# Patient Record
Sex: Male | Born: 1960 | Race: White | Hispanic: No | Smoking: Never smoker
Health system: Southern US, Community
[De-identification: ages and names within clinical notes are randomized; demographics above are authoritative.]

## PROBLEM LIST (undated history)

## (undated) DIAGNOSIS — N200 Calculus of kidney: Secondary | ICD-10-CM

## (undated) DIAGNOSIS — E785 Hyperlipidemia, unspecified: Secondary | ICD-10-CM

## (undated) DIAGNOSIS — N401 Enlarged prostate with lower urinary tract symptoms: Secondary | ICD-10-CM

## (undated) DIAGNOSIS — K625 Hemorrhage of anus and rectum: Secondary | ICD-10-CM

## (undated) DIAGNOSIS — I10 Essential (primary) hypertension: Secondary | ICD-10-CM

## (undated) DIAGNOSIS — Z8042 Family history of malignant neoplasm of prostate: Secondary | ICD-10-CM

## (undated) DIAGNOSIS — R972 Elevated prostate specific antigen [PSA]: Secondary | ICD-10-CM

## (undated) DIAGNOSIS — N138 Other obstructive and reflux uropathy: Secondary | ICD-10-CM

## (undated) DIAGNOSIS — T7840XA Allergy, unspecified, initial encounter: Secondary | ICD-10-CM

## (undated) DIAGNOSIS — J310 Chronic rhinitis: Secondary | ICD-10-CM

## (undated) DIAGNOSIS — D500 Iron deficiency anemia secondary to blood loss (chronic): Secondary | ICD-10-CM

## (undated) DIAGNOSIS — R002 Palpitations: Secondary | ICD-10-CM

## (undated) HISTORY — DX: Hyperlipidemia, unspecified: E78.5

## (undated) HISTORY — DX: Elevated prostate specific antigen (PSA): R97.20

## (undated) HISTORY — PX: COLONOSCOPY: SHX174

## (undated) HISTORY — DX: Chronic rhinitis: J31.0

## (undated) HISTORY — DX: Iron deficiency anemia secondary to blood loss (chronic): D50.0

## (undated) HISTORY — DX: Palpitations: R00.2

## (undated) HISTORY — DX: Family history of malignant neoplasm of prostate: Z80.42

## (undated) HISTORY — DX: Benign prostatic hyperplasia with lower urinary tract symptoms: N40.1

## (undated) HISTORY — DX: Hemorrhage of anus and rectum: K62.5

## (undated) HISTORY — DX: Other obstructive and reflux uropathy: N13.8

## (undated) HISTORY — DX: Calculus of kidney: N20.0

## (undated) HISTORY — DX: Allergy, unspecified, initial encounter: T78.40XA

## (undated) HISTORY — DX: Essential (primary) hypertension: I10

---

## 1993-06-08 DIAGNOSIS — Z87448 Personal history of other diseases of urinary system: Secondary | ICD-10-CM

## 1993-06-08 DIAGNOSIS — Z87898 Personal history of other specified conditions: Secondary | ICD-10-CM

## 1993-06-08 HISTORY — DX: Personal history of other diseases of urinary system: Z87.448

## 1993-06-08 HISTORY — DX: Personal history of other specified conditions: Z87.898

## 1999-11-27 ENCOUNTER — Encounter: Admission: RE | Admit: 1999-11-27 | Discharge: 1999-11-27 | Payer: Self-pay | Admitting: *Deleted

## 1999-11-27 ENCOUNTER — Encounter: Payer: Self-pay | Admitting: *Deleted

## 2005-11-30 ENCOUNTER — Ambulatory Visit: Payer: Self-pay | Admitting: Endocrinology

## 2006-11-17 ENCOUNTER — Ambulatory Visit: Payer: Self-pay | Admitting: Endocrinology

## 2006-11-17 LAB — CONVERTED CEMR LAB
BUN: 13 mg/dL (ref 6–23)
Basophils Absolute: 0 10*3/uL (ref 0.0–0.1)
Basophils Relative: 0.6 % (ref 0.0–1.0)
CO2: 28 meq/L (ref 19–32)
Calcium: 9.6 mg/dL (ref 8.4–10.5)
Chloride: 103 meq/L (ref 96–112)
Creatinine, Ser: 1 mg/dL (ref 0.4–1.5)
Eosinophils Absolute: 0.3 10*3/uL (ref 0.0–0.6)
Eosinophils Relative: 3.3 % (ref 0.0–5.0)
GFR calc Af Amer: 103 mL/min
GFR calc non Af Amer: 85 mL/min
Glucose, Bld: 89 mg/dL (ref 70–99)
HCT: 45.6 % (ref 39.0–52.0)
Hemoglobin: 16 g/dL (ref 13.0–17.0)
Lymphocytes Relative: 34.4 % (ref 12.0–46.0)
MCHC: 35.2 g/dL (ref 30.0–36.0)
MCV: 91.5 fL (ref 78.0–100.0)
Monocytes Absolute: 0.7 10*3/uL (ref 0.2–0.7)
Monocytes Relative: 9.4 % (ref 3.0–11.0)
Neutro Abs: 4.1 10*3/uL (ref 1.4–7.7)
Neutrophils Relative %: 52.3 % (ref 43.0–77.0)
Platelets: 250 10*3/uL (ref 150–400)
Potassium: 4 meq/L (ref 3.5–5.1)
RBC: 4.99 M/uL (ref 4.22–5.81)
RDW: 11.7 % (ref 11.5–14.6)
Sodium: 139 meq/L (ref 135–145)
TSH: 1.45 microintl units/mL (ref 0.35–5.50)
WBC: 7.7 10*3/uL (ref 4.5–10.5)

## 2006-11-30 ENCOUNTER — Ambulatory Visit: Payer: Self-pay | Admitting: Endocrinology

## 2007-01-17 ENCOUNTER — Encounter: Payer: Self-pay | Admitting: Endocrinology

## 2007-01-18 ENCOUNTER — Ambulatory Visit: Payer: Self-pay | Admitting: Endocrinology

## 2007-01-18 LAB — CONVERTED CEMR LAB
ALT: 22 units/L (ref 0–53)
AST: 21 units/L (ref 0–37)
Albumin: 4.2 g/dL (ref 3.5–5.2)
Alkaline Phosphatase: 89 units/L (ref 39–117)
BUN: 11 mg/dL (ref 6–23)
Basophils Absolute: 0 10*3/uL (ref 0.0–0.1)
Basophils Relative: 0.1 % (ref 0.0–1.0)
Bilirubin Urine: NEGATIVE
Bilirubin, Direct: 0.1 mg/dL (ref 0.0–0.3)
CO2: 28 meq/L (ref 19–32)
Calcium: 8.7 mg/dL (ref 8.4–10.5)
Chloride: 107 meq/L (ref 96–112)
Cholesterol: 227 mg/dL (ref 0–200)
Creatinine, Ser: 0.9 mg/dL (ref 0.4–1.5)
Direct LDL: 163.6 mg/dL
Eosinophils Absolute: 0.3 10*3/uL (ref 0.0–0.6)
Eosinophils Relative: 3.9 % (ref 0.0–5.0)
GFR calc Af Amer: 116 mL/min
GFR calc non Af Amer: 96 mL/min
Glucose, Bld: 96 mg/dL (ref 70–99)
HCT: 44.8 % (ref 39.0–52.0)
HDL: 33.2 mg/dL — ABNORMAL LOW (ref >39.0)
Hemoglobin, Urine: NEGATIVE
Hemoglobin: 15.5 g/dL (ref 13.0–17.0)
Ketones, ur: NEGATIVE mg/dL
Leukocytes, UA: NEGATIVE
Lymphocytes Relative: 25.9 % (ref 12.0–46.0)
MCHC: 34.5 g/dL (ref 30.0–36.0)
MCV: 91.4 fL (ref 78.0–100.0)
Monocytes Absolute: 0.6 10*3/uL (ref 0.2–0.7)
Monocytes Relative: 8.2 % (ref 3.0–11.0)
Neutro Abs: 4.6 10*3/uL (ref 1.4–7.7)
Neutrophils Relative %: 61.9 % (ref 43.0–77.0)
Nitrite: NEGATIVE
PSA: 0.5 ng/mL (ref 0.10–4.00)
Platelets: 245 10*3/uL (ref 150–400)
Potassium: 4.3 meq/L (ref 3.5–5.1)
RBC: 4.9 M/uL (ref 4.22–5.81)
RDW: 11.9 % (ref 11.5–14.6)
Sodium: 141 meq/L (ref 135–145)
Specific Gravity, Urine: 1.01 (ref 1.000–1.03)
TSH: 1.28 microintl units/mL (ref 0.35–5.50)
Total Bilirubin: 0.9 mg/dL (ref 0.3–1.2)
Total CHOL/HDL Ratio: 6.8
Total Protein, Urine: NEGATIVE mg/dL
Total Protein: 7.2 g/dL (ref 6.0–8.3)
Triglycerides: 160 mg/dL — ABNORMAL HIGH (ref 0–149)
Urine Glucose: NEGATIVE mg/dL
Urobilinogen, UA: 0.2 (ref 0.0–1.0)
VLDL: 32 mg/dL (ref 0–40)
WBC: 7.4 10*3/uL (ref 4.5–10.5)
pH: 7 (ref 5.0–8.0)

## 2007-02-16 ENCOUNTER — Ambulatory Visit: Payer: Self-pay | Admitting: Gastroenterology

## 2007-02-16 ENCOUNTER — Ambulatory Visit: Payer: Self-pay | Admitting: Endocrinology

## 2007-02-16 LAB — CONVERTED CEMR LAB
ALT: 27 units/L (ref 0–53)
AST: 22 units/L (ref 0–37)
Albumin: 4.1 g/dL (ref 3.5–5.2)
Alkaline Phosphatase: 87 units/L (ref 39–117)
Bilirubin, Direct: 0.1 mg/dL (ref 0.0–0.3)
Cholesterol: 136 mg/dL (ref 0–200)
HDL: 31.1 mg/dL — ABNORMAL LOW (ref >39.0)
LDL Cholesterol: 81 mg/dL (ref 0–99)
Total Bilirubin: 0.9 mg/dL (ref 0.3–1.2)
Total CHOL/HDL Ratio: 4.4
Total Protein: 7.3 g/dL (ref 6.0–8.3)
Triglycerides: 121 mg/dL (ref 0–149)
VLDL: 24 mg/dL (ref 0–40)

## 2007-02-24 ENCOUNTER — Ambulatory Visit: Payer: Self-pay | Admitting: Endocrinology

## 2007-04-08 ENCOUNTER — Ambulatory Visit: Payer: Self-pay | Admitting: Gastroenterology

## 2007-04-08 HISTORY — PX: COLONOSCOPY: SHX174

## 2007-05-26 ENCOUNTER — Telehealth (INDEPENDENT_AMBULATORY_CARE_PROVIDER_SITE_OTHER): Payer: Self-pay | Admitting: *Deleted

## 2007-05-26 ENCOUNTER — Ambulatory Visit: Payer: Self-pay | Admitting: Endocrinology

## 2007-08-25 DIAGNOSIS — E785 Hyperlipidemia, unspecified: Secondary | ICD-10-CM | POA: Insufficient documentation

## 2007-08-25 DIAGNOSIS — L259 Unspecified contact dermatitis, unspecified cause: Secondary | ICD-10-CM | POA: Insufficient documentation

## 2007-08-25 DIAGNOSIS — K625 Hemorrhage of anus and rectum: Secondary | ICD-10-CM

## 2007-08-25 HISTORY — DX: Hemorrhage of anus and rectum: K62.5

## 2007-08-25 HISTORY — DX: Hyperlipidemia, unspecified: E78.5

## 2008-02-20 ENCOUNTER — Ambulatory Visit: Payer: Self-pay | Admitting: Endocrinology

## 2008-02-22 LAB — CONVERTED CEMR LAB
ALT: 23 units/L (ref 0–53)
AST: 18 units/L (ref 0–37)
Albumin: 4.6 g/dL (ref 3.5–5.2)
Alkaline Phosphatase: 91 units/L (ref 39–117)
BUN: 19 mg/dL (ref 6–23)
Basophils Absolute: 0 10*3/uL (ref 0.0–0.1)
Basophils Relative: 0.6 % (ref 0.0–3.0)
Bilirubin Urine: NEGATIVE
Bilirubin, Direct: 0.1 mg/dL (ref 0.0–0.3)
CO2: 28 meq/L (ref 19–32)
Calcium: 8.9 mg/dL (ref 8.4–10.5)
Chloride: 107 meq/L (ref 96–112)
Cholesterol: 128 mg/dL (ref 0–200)
Creatinine, Ser: 1 mg/dL (ref 0.4–1.5)
Eosinophils Absolute: 0.1 10*3/uL (ref 0.0–0.7)
Eosinophils Relative: 2.3 % (ref 0.0–5.0)
GFR calc Af Amer: 102 mL/min
GFR calc non Af Amer: 84 mL/min
Glucose, Bld: 96 mg/dL (ref 70–99)
HCT: 42.8 % (ref 39.0–52.0)
HDL: 23.6 mg/dL — ABNORMAL LOW (ref >39.0)
Hemoglobin, Urine: NEGATIVE
Hemoglobin: 14.9 g/dL (ref 13.0–17.0)
Ketones, ur: NEGATIVE mg/dL
LDL Cholesterol: 78 mg/dL (ref 0–99)
Leukocytes, UA: NEGATIVE
Lymphocytes Relative: 31 % (ref 12.0–46.0)
MCHC: 34.9 g/dL (ref 30.0–36.0)
MCV: 93.7 fL (ref 78.0–100.0)
Monocytes Absolute: 0.6 10*3/uL (ref 0.1–1.0)
Monocytes Relative: 9.3 % (ref 3.0–12.0)
Neutro Abs: 3.4 10*3/uL (ref 1.4–7.7)
Neutrophils Relative %: 56.8 % (ref 43.0–77.0)
Nitrite: NEGATIVE
PSA: 0.48 ng/mL (ref 0.10–4.00)
Platelets: 222 10*3/uL (ref 150–400)
Potassium: 4.2 meq/L (ref 3.5–5.1)
RBC: 4.57 M/uL (ref 4.22–5.81)
RDW: 12.1 % (ref 11.5–14.6)
Sodium: 142 meq/L (ref 135–145)
Specific Gravity, Urine: 1.025 (ref 1.000–1.03)
TSH: 1.57 microintl units/mL (ref 0.35–5.50)
Total Bilirubin: 1.1 mg/dL (ref 0.3–1.2)
Total CHOL/HDL Ratio: 5.4
Total Protein, Urine: NEGATIVE mg/dL
Total Protein: 7 g/dL (ref 6.0–8.3)
Triglycerides: 132 mg/dL (ref 0–149)
Urine Glucose: NEGATIVE mg/dL
Urobilinogen, UA: 0.2 (ref 0.0–1.0)
VLDL: 26 mg/dL (ref 0–40)
WBC: 6 10*3/uL (ref 4.5–10.5)
pH: 6 (ref 5.0–8.0)

## 2008-02-27 ENCOUNTER — Ambulatory Visit: Payer: Self-pay | Admitting: Endocrinology

## 2008-06-08 HISTORY — PX: CYSTOSCOPY W/ URETEROSCOPY W/ LITHOTRIPSY: SUR380

## 2008-08-06 ENCOUNTER — Ambulatory Visit: Payer: Self-pay | Admitting: Endocrinology

## 2008-08-06 LAB — CONVERTED CEMR LAB
Bilirubin Urine: NEGATIVE
Glucose, Urine, Semiquant: NEGATIVE
Ketones, urine, test strip: NEGATIVE
Nitrite: NEGATIVE
Protein, U semiquant: NEGATIVE
Specific Gravity, Urine: <1.005
Urobilinogen, UA: 0.2
WBC Urine, dipstick: NEGATIVE
pH: 5

## 2008-08-07 LAB — CONVERTED CEMR LAB
Basophils Absolute: 0 10*3/uL (ref 0.0–0.1)
Basophils Relative: 0.5 % (ref 0.0–3.0)
Eosinophils Absolute: 0.2 10*3/uL (ref 0.0–0.7)
Eosinophils Relative: 1.7 % (ref 0.0–5.0)
HCT: 40.3 % (ref 39.0–52.0)
Hemoglobin: 14.2 g/dL (ref 13.0–17.0)
INR: 1 (ref 0.8–1.0)
Lymphocytes Relative: 24.1 % (ref 12.0–46.0)
MCHC: 35.2 g/dL (ref 30.0–36.0)
MCV: 92 fL (ref 78.0–100.0)
Monocytes Absolute: 0.8 10*3/uL (ref 0.1–1.0)
Monocytes Relative: 8.3 % (ref 3.0–12.0)
Neutro Abs: 6 10*3/uL (ref 1.4–7.7)
Neutrophils Relative %: 65.4 % (ref 43.0–77.0)
Platelets: 270 10*3/uL (ref 150–400)
Prothrombin Time: 10.6 s — ABNORMAL LOW (ref 10.9–13.3)
RBC: 4.38 M/uL (ref 4.22–5.81)
RDW: 12.2 % (ref 11.5–14.6)
WBC: 9.2 10*3/uL (ref 4.5–10.5)

## 2008-08-16 ENCOUNTER — Telehealth (INDEPENDENT_AMBULATORY_CARE_PROVIDER_SITE_OTHER): Payer: Self-pay | Admitting: *Deleted

## 2008-08-22 ENCOUNTER — Encounter: Payer: Self-pay | Admitting: Endocrinology

## 2008-08-29 ENCOUNTER — Encounter: Payer: Self-pay | Admitting: Endocrinology

## 2008-09-03 ENCOUNTER — Ambulatory Visit (HOSPITAL_COMMUNITY): Admission: RE | Admit: 2008-09-03 | Discharge: 2008-09-03 | Payer: Self-pay | Admitting: Urology

## 2008-09-25 ENCOUNTER — Encounter: Payer: Self-pay | Admitting: Endocrinology

## 2008-10-31 ENCOUNTER — Encounter: Payer: Self-pay | Admitting: Endocrinology

## 2008-11-12 ENCOUNTER — Ambulatory Visit (HOSPITAL_BASED_OUTPATIENT_CLINIC_OR_DEPARTMENT_OTHER): Admission: RE | Admit: 2008-11-12 | Discharge: 2008-11-12 | Payer: Self-pay | Admitting: Urology

## 2008-12-05 ENCOUNTER — Encounter: Payer: Self-pay | Admitting: Endocrinology

## 2009-05-15 ENCOUNTER — Ambulatory Visit: Payer: Self-pay | Admitting: Endocrinology

## 2009-05-15 DIAGNOSIS — N209 Urinary calculus, unspecified: Secondary | ICD-10-CM | POA: Insufficient documentation

## 2009-05-15 DIAGNOSIS — N200 Calculus of kidney: Secondary | ICD-10-CM

## 2009-05-15 DIAGNOSIS — I10 Essential (primary) hypertension: Secondary | ICD-10-CM | POA: Insufficient documentation

## 2009-05-15 HISTORY — DX: Essential (primary) hypertension: I10

## 2009-05-15 HISTORY — DX: Calculus of kidney: N20.0

## 2009-05-15 LAB — CONVERTED CEMR LAB
Albumin: 4.5 g/dL (ref 3.5–5.2)
CO2: 28 meq/L (ref 19–32)
Calcium, Total (PTH): 9 mg/dL (ref 8.4–10.5)
Calcium: 9 mg/dL (ref 8.4–10.5)
Creatinine, Ser: 1 mg/dL (ref 0.4–1.5)
Eosinophils Relative: 2.3 % (ref 0.0–5.0)
Glucose, Bld: 88 mg/dL (ref 70–99)
HCT: 46 % (ref 39.0–52.0)
Hemoglobin: 15.6 g/dL (ref 13.0–17.0)
Lymphs Abs: 2.1 10*3/uL (ref 0.7–4.0)
MCV: 94.6 fL (ref 78.0–100.0)
Monocytes Relative: 10.1 % (ref 3.0–12.0)
Neutro Abs: 3.8 10*3/uL (ref 1.4–7.7)
RDW: 12.1 % (ref 11.5–14.6)
Sed Rate: 6 mm/hr (ref 0–22)
TSH: 1.34 microintl units/mL (ref 0.35–5.50)
Total CHOL/HDL Ratio: 4
Total Protein: 7.1 g/dL (ref 6.0–8.3)
Triglycerides: 101 mg/dL (ref 0.0–149.0)
Uric Acid, Serum: 4.8 mg/dL (ref 4.0–7.8)

## 2009-07-04 ENCOUNTER — Ambulatory Visit: Payer: Self-pay | Admitting: Endocrinology

## 2009-07-04 DIAGNOSIS — R002 Palpitations: Secondary | ICD-10-CM

## 2009-07-04 HISTORY — DX: Palpitations: R00.2

## 2009-10-23 ENCOUNTER — Ambulatory Visit: Payer: Self-pay | Admitting: Endocrinology

## 2009-10-23 ENCOUNTER — Ambulatory Visit: Payer: Self-pay | Admitting: Internal Medicine

## 2009-10-23 LAB — CONVERTED CEMR LAB
BUN: 17 mg/dL (ref 6–23)
Basophils Absolute: 0 10*3/uL (ref 0.0–0.1)
Bilirubin, Direct: 0.1 mg/dL (ref 0.0–0.3)
Chloride: 105 meq/L (ref 96–112)
Cholesterol: 147 mg/dL (ref 0–200)
Creatinine, Ser: 1 mg/dL (ref 0.4–1.5)
Eosinophils Absolute: 0.1 10*3/uL (ref 0.0–0.7)
Eosinophils Relative: 1.8 % (ref 0.0–5.0)
Hemoglobin, Urine: NEGATIVE
Ketones, ur: NEGATIVE mg/dL
LDL Cholesterol: 94 mg/dL (ref 0–99)
MCHC: 34.7 g/dL (ref 30.0–36.0)
MCV: 93.5 fL (ref 78.0–100.0)
Monocytes Absolute: 0.7 10*3/uL (ref 0.1–1.0)
Neutrophils Relative %: 60.5 % (ref 43.0–77.0)
Platelets: 248 10*3/uL (ref 150.0–400.0)
RDW: 12.9 % (ref 11.5–14.6)
Total Bilirubin: 0.7 mg/dL (ref 0.3–1.2)
Total Protein, Urine: NEGATIVE mg/dL
Triglycerides: 88 mg/dL (ref 0.0–149.0)
Urine Glucose: NEGATIVE mg/dL
Urobilinogen, UA: 0.2 (ref 0.0–1.0)
VLDL: 17.6 mg/dL (ref 0.0–40.0)
WBC: 7.8 10*3/uL (ref 4.5–10.5)

## 2009-10-31 ENCOUNTER — Ambulatory Visit: Payer: Self-pay | Admitting: Endocrinology

## 2010-07-06 LAB — CONVERTED CEMR LAB
Bilirubin Urine: NEGATIVE
CO2: 30 meq/L (ref 19–32)
Calcium: 9.5 mg/dL (ref 8.4–10.5)
Chloride: 108 meq/L (ref 96–112)
Leukocytes, UA: NEGATIVE
Nitrite: NEGATIVE
PSA: 0.62 ng/mL (ref 0.10–4.00)
Potassium: 3.9 meq/L (ref 3.5–5.1)
Sodium: 144 meq/L (ref 135–145)
Specific Gravity, Urine: 1.015 (ref 1.000–1.030)
pH: 6.5 (ref 5.0–8.0)

## 2010-07-08 NOTE — Assessment & Plan Note (Signed)
Summary: DR SAE PT/NO CLINIC-RASH IN  [facility]   Vital Signs:  Patient profile:   50 year old male Height:      73 inches (185.42 cm) Weight:      202.0 pounds (91.82 kg) O2 Sat:      96 % on Room air Temp:     97.3 degrees F (36.28 degrees C) oral Pulse rate:   73 / minute BP sitting:   110 / 72  (left arm) Cuff size:   large  Vitals Entered By: Orlan Leavens (Oct 23, 2009 9:30 AM)  O2 Flow:  Room air CC: ? Poison ivy on (L) hand, face and eye, Rash Is Patient Diabetic? No Pain Assessment Patient in pain? no        Primary Care Provider:  Minus Breeding MD  CC:  ? Poison ivy on (L) hand, face and eye, and Rash.  History of Present Illness:  Rash      This is a 50 year old man who presents with Rash.  The symptoms began 3 days ago.  The severity is described as moderate.  spreading from hand to face involving lip, nose and left eye skin -.  The patient reports hives, itching, and redness, but denies pustules, ulcers, weeping, oozing, and tenderness.  The rash is located on the face, neck, and left hand.  The rash is worse with heat, worse with scratching, worse with sweating, and better with cold.  The patient denies the following symptoms: fever, headache, facial swelling, burning, difficulty breathing, abdominal pain, nausea, vomiting, diarrhea, sore throat, and eye symptoms.  The patient reports a history of new topical exposure.  The patient denies history of recent infection, recent antibiotic use, and recent travel.    Clinical Review Panels:  CBC   WBC:  6.8 (05/15/2009)   RBC:  4.86 (05/15/2009)   Hgb:  15.6 (05/15/2009)   Hct:  46.0 (05/15/2009)   Platelets:  228.0 (05/15/2009)   MCV  94.6 (05/15/2009)   MCHC  34.0 (05/15/2009)   RDW  12.1 (05/15/2009)   PMN:  55.8 (05/15/2009)   Lymphs:  31.3 (05/15/2009)   Monos:  10.1 (05/15/2009)   Eosinophils:  2.3 (05/15/2009)   Basophil:  0.5 (05/15/2009)  Complete Metabolic Panel   Glucose:  81  (07/04/2009)   Sodium:  144 (07/04/2009)   Potassium:  3.9 (07/04/2009)   Chloride:  108 (07/04/2009)   CO2:  30 (07/04/2009)   BUN:  11 (07/04/2009)   Creatinine:  1.0 (07/04/2009)   Albumin:  4.5 (05/15/2009)   Total Protein:  7.1 (05/15/2009)   Calcium:  9.5 (07/04/2009)   Total Bili:  0.9 (05/15/2009)   Alk Phos:  83 (05/15/2009)   SGPT (ALT):  21 (05/15/2009)   SGOT (AST):  18 (05/15/2009)   Current Medications (verified): 1)  Amlodipine Besylate 2.5 Mg Tabs (Amlodipine Besylate) .... Take 1 By Mouth Once Daily 2)  Simvastatin 80 Mg Tabs (Simvastatin) .... Take 1 At Bedtime 3)  Lisinopril-Hydrochlorothiazide 10-12.5 Mg Tabs (Lisinopril-Hydrochlorothiazide) .Marland Kitchen.. 1 Qd  Allergies (verified): 1)  ! Benadryl  Past History:  Past Medical History: Hx of dyslipidemia Abnormal electrocardiogram Contact dermatitis hypertension  Review of Systems  The patient denies vision loss, chest pain, syncope, and headaches.    Physical Exam  General:  alert, well-developed, well-nourished, and cooperative to examination.   nontoxic Eyes:  vision grossly intact; pupils equal, round and reactive to light.  conjunctiva normal.   Left eyelids with mild edema  and erythema Lungs:  normal respiratory effort, no intercostal retractions or use of accessory muscles; normal breath sounds bilaterally - no crackles and no wheezes.    Heart:  normal rate, regular rhythm, no murmur, and no rub. BLE without edema.  Skin:  allergic hive reaction on right 3rd finger, hand, neck and right upper lip/nares c/w poison ivy dermatitis - excroiation, no open ulceration or oozing - Psych:  Oriented X3, memory intact for recent and remote, normally interactive, good eye contact, not anxious appearing, not depressed appearing, and not agitated.      Impression & Recommendations:  Problem # 1:  POISON IVY DERMATITIS (ICD-692.6)  tx systemicaly given speading symptoms and intol of benadryl -  also allg eye  drops for involvement of eyelid and periorbital area His updated medication list for this problem includes:    Prednisone (pak) 5 Mg Tabs (Prednisone) .Marland Kitchen... As directed x 6 days  Discussed avoidance of triggers and symptomatic treatment.   Orders: Prescription Created Electronically [phone])  Complete Medication List: 1)  Amlodipine Besylate 2.5 Mg Tabs (Amlodipine besylate) .... Take 1 by mouth once daily 2)  Simvastatin 80 Mg Tabs (Simvastatin) .... Take 1 at bedtime 3)  Lisinopril-hydrochlorothiazide 10-12.5 Mg Tabs (Lisinopril-hydrochlorothiazide) .Marland Kitchen.. 1 qd 4)  Prednisone (pak) 5 Mg Tabs (Prednisone) .... As directed x 6 days 5)  Pataday 0.2 % Soln (Olopatadine hcl) .Marland Kitchen.. 1 drop in eye(s) daily as needed for itch and irritation  Patient Instructions: 1)  it was good to see you today. 2)  will treat the skin reaction with Pred Pak and allergy eye drops as discussed - your prescriptions have been electronically submitted to your pharmacy. Please take as directed. Contact our office if you believe you're having problems with the medication(s).  3)  learn the different "looks" of poison ivy so that you can avoid it in the future - always wear protective gloves and long sleeves/pants when working outdoors, then undress, launder and shower immediately when done! 4)  Please keep appointment with your primary doctor as scheduled for physical next week, call sooner if problems Prescriptions: PATADAY 0.2 % SOLN (OLOPATADINE HCL) 1 drop in eye(s) daily as needed for itch and irritation  #1 x 0   Entered and Authorized by:   Newt Lukes MD   Signed by:   Newt Lukes MD on 10/23/2009   Method used:   Electronically to        [facility]  [address] [phone][facility] (retail)       [address] [place], [place]  [address]       Ph: [phone] or [phone]       Fax: [phone]   RxID:   [id number] PREDNISONE (PAK) 5 MG TABS (PREDNISONE) as directed x 6 days  #1 x 0   Entered  and Authorized by:   Newt Lukes MD   Signed by:   Newt Lukes MD on 10/23/2009   Method used:   Electronically to        [facility]  [address] [phone]* (retail)       [address] [place], [place]  [address]       Ph: [phone] or [phone]       Fax: [phone]   RxID:   [id number]

## 2010-07-08 NOTE — Assessment & Plan Note (Signed)
Summary: ELEV BP /176/116 LAST NIGHT /[facility]   Vital Signs:  Patient profile:   50 year old male Height:      73 inches (185.42 cm) Weight:      207.50 pounds (94.32 kg) O2 Sat:      95 % on Room air Temp:     97.1 degrees F (36.17 degrees C) oral Pulse rate:   84 / minute BP sitting:   150 / 88  (left arm) Cuff size:   large  Vitals Entered By: Josph Macho CMA (July 04, 2009 10:39 AM)  O2 Flow:  Room air CC: [facility] last [facility] Is Patient Diabetic? No   CC:  Elevated BP last night 176/116 (at [facility])/ CF.  History of Present Illness: pt states 2 weeks of mild bifrontal headache. he takes the norvasc as rx'ed.   pt also has 1 month of intermittent palpitations, doe, and prominence of "the veins on my left hand."  Current Medications (verified): 1)  Amlodipine Besylate 2.5 Mg Tabs (Amlodipine Besylate) .... Take 1 By Mouth Once Daily  Physical Is Due No Addtional Refills Until Appt 2)  Simvastatin 80 Mg Tabs (Simvastatin) .... Take 1 At Bedtime Physical Appt Is Due No Addtional Refills Until Appt  Allergies (verified): 1)  ! Benadryl  Past History:  Past Medical History: Last updated: 01/17/2007 Hx of dyslipidemia Abnormal electrocardiogram Contact dermatitis  Review of Systems  The patient denies vision loss.         denies nausea  Physical Exam  General:  normal appearance.   Head:  head: no deformity eyes: no periorbital swelling, no proptosis external nose and ears are normal Lungs:  Clear to auscultation bilaterally. Normal respiratory effort.  Heart:  Regular rate and rhythm without murmurs or gallops noted. Normal S1,S2.   Extremities:  left hand is normal to my exam   Impression & Recommendations:  Problem # 1:  HYPERTENSION (ICD-401.9) needs increased rx  Problem # 2:  HEADACHE (ICD-784.0) ? related to #1  Problem # 3:  PALPITATIONS (ICD-785.1) uncertain etiology  Problem # 4:  DYSPNEA (ICD-786.05)  uncertain  etiology  Orders: T-D-Dimer Fibrin Derivatives Quantitive ([address]-60454) T-2 View CXR (71020TC) TLB-BNP (B-Natriuretic Peptide) (83880-BNPR)  Medications Added to Medication List This Visit: 1)  Amlodipine Besylate 2.5 Mg Tabs (Amlodipine besylate) .... Take 1 by mouth once daily 2)  Simvastatin 80 Mg Tabs (Simvastatin) .... Take 1 at bedtime 3)  Lisinopril-hydrochlorothiazide 10-12.5 Mg Tabs (Lisinopril-hydrochlorothiazide) .Marland Kitchen.. 1 qd  Other Orders: EKG w/ Interpretation (93000) TLB-BMP (Basic Metabolic Panel-BMET) (80048-METABOL) TLB-TSH (Thyroid Stimulating Hormone) (84443-TSH) TLB-PSA (Prostate Specific Antigen) (84153-PSA) TLB-Udip w/ Micro (81001-URINE) Est. Patient Level IV ([address])  Patient Instructions: 1)  blood tests and x ray today. 2)  add lisinopril 10/12.5, 1/day. 3)  physical soon. Prescriptions: SIMVASTATIN 80 MG TABS (SIMVASTATIN) TAKE 1 at bedtime  #30 x 11   Entered and Authorized by:   Minus Breeding MD   Signed by:   Minus Breeding MD on 07/04/2009   Method used:   Electronically to        [facility]  [address] [phone][facility] (retail)       [address]       [place], [place]  [address]       Ph: [phone] or [phone]       Fax: [phone]   RxID:   [id number] AMLODIPINE BESYLATE 2.5 MG TABS (AMLODIPINE BESYLATE)  take 1 by mouth once daily  #30 x 11   Entered and Authorized by:   Minus Breeding MD   Signed by:   Minus Breeding MD on 07/04/2009   Method used:   Electronically to        [facility]  [address] [phone][facility] ([facility])       [address]       [place], [place]  [address]       Ph: [phone] or [phone]       Fax: [phone]   RxID:   [id number] LISINOPRIL-HYDROCHLOROTHIAZIDE 10-12.5 MG TABS (LISINOPRIL-HYDROCHLOROTHIAZIDE) 1 qd  #30 x 11   Entered and Authorized by:   Minus Breeding MD   Signed by:   Minus Breeding MD on 07/04/2009   Method used:   [facility]ronically to        [facility]  [address] [phone][facility] (retail)       [address] [place], [place]  [address]       Ph: [phone] or [phone]       Fax: [phone]   RxID:   [id number]

## 2010-07-08 NOTE — Assessment & Plan Note (Signed)
Summary: PHYSICAL---[facility]C   Vital Signs:  Patient profile:   50 year old male Height:      73 inches (185.42 cm) Weight:      202.38 pounds (91.99 kg) O2 Sat:      93 % on Room air Temp:     98.5 degrees F (36.94 degrees C) oral Pulse rate:   80 / minute BP sitting:   108 / 62  (left arm) Cuff size:   large  Vitals Entered By: Josph Macho RMA (Oct 31, 2009 8:36 AM)  O2 Flow:  Room air CC: Physical/ pt is no longer using Pataday/ pt had EKG on 07/04/09/ CF Is Patient Diabetic? No   Primary Provider:  Minus Breeding MD  CC:  Physical/ pt is no longer using Pataday/ pt had EKG on 07/04/09/ CF.  History of Present Illness: here for regular wellness examination.  He's feeling pretty well in general, and does not drink or smoke.   Current Medications (verified): 1)  Amlodipine Besylate 2.5 Mg Tabs (Amlodipine Besylate) .... Take 1 By Mouth Once Daily 2)  Simvastatin 80 Mg Tabs (Simvastatin) .... Take 1 At Bedtime 3)  Lisinopril-Hydrochlorothiazide 10-12.5 Mg Tabs (Lisinopril-Hydrochlorothiazide) .Marland Kitchen.. 1 Qd 4)  Pataday 0.2 % Soln (Olopatadine Hcl) .Marland Kitchen.. 1 Drop in Eye(S) Daily As Needed For Itch and Irritation  Allergies (verified): 1)  ! Benadryl  Past History:  Past Medical History: Last updated: 10/23/2009 Hx of dyslipidemia Abnormal electrocardiogram Contact dermatitis hypertension  Family History: Reviewed history from 02/27/2008 and no changes required. brother had prostate cancer  Social History: Reviewed history from 02/27/2008 and no changes required. married works for [employer] alcohol is 1-2/week never a smoker  Review of Systems  The patient denies fever, weight loss, weight gain, vision loss, decreased hearing, syncope, dyspnea on exertion, prolonged cough, headaches, abdominal pain, melena, severe indigestion/heartburn, hematuria, suspicious skin lesions, and depression.         he seldom gets chest pain, and this happens when he is in bed.   lasts 20 minutes.  he seldom has solid dysphagia.  he rarely gets brbpr.  Physical Exam  General:  normal appearance.   Head:  head: no deformity eyes: no periorbital swelling, no proptosis external nose and ears are normal mouth: no lesion seen Neck:  Supple without thyroid enlargement or tenderness.  Lungs:  Clear to auscultation bilaterally. Normal respiratory effort.  Heart:  Regular rate and rhythm without murmurs or gallops noted. Normal S1,S2.   Abdomen:  abdomen is soft, nontender.  no hepatosplenomegaly.   not distended.  no hernia  Rectal:  normal external and internal exam.  heme neg  Prostate:  Normal size prostate without masses or tenderness.  Msk:  muscle bulk and strength are grossly normal.  no obvious joint swelling.  gait is normal and steady  Pulses:  dorsalis pedis intact bilat.  no carotid bruit  Extremities:  no deformity.  no ulcer on the feet.  feet are of normal color and temp.  no edema  Neurologic:  sensation is intact to touch on the feet cn 2-12 grossly intact.   readily moves all 4's.    Skin:  contact dermatitis in almost resolved. Cervical Nodes:  No significant adenopathy.  Psych:  Alert and cooperative; normal mood and affect; normal attention span and concentration.     Impression & Recommendations:  Problem # 1:  ROUTINE GENERAL MEDICAL EXAM@HEALTH  CARE FACL (ICD-V70.0)  Problem # 2:  HYPERTENSION (ICD-401.9) overcontrolled  Problem #  3:  chest pain ? due to gerd  Medications Added to Medication List This Visit: 1)  Lisinopril-hydrochlorothiazide 10-12.5 Mg Tabs (Lisinopril-hydrochlorothiazide) .Marland Kitchen.. 1 once daily  Other Orders: Est. Patient 40-64 years ([address])   Patient Instructions: 1)  try prilosec-otc 20 mg once daily, to see if this helps symptoms 2)  try stopping the amlodipine. 3)  check your blood prssure here in a few weeks. 4)  return 1 year. 5)  please consider these measures for your health:  minimize alcohol.  do  not use tobacco products.  have a colonoscopy at least every 10 years from age 80.  keep firearms safely stored.  always use seat belts.  have working smoke alarms in your home.  see the dentist regularly.  never drive under the influence of alcohol or drugs (including prescription drugs).  those with fair skin should take precautions against the sun.

## 2010-08-22 ENCOUNTER — Telehealth: Payer: Self-pay | Admitting: Endocrinology

## 2010-08-26 NOTE — Progress Notes (Signed)
Summary: refill denial?  Phone Note Call from Patient Call back at Home Phone [phone]   Caller: Patient Call For: Minus Breeding MD Summary of Call: Pt states Dr Everardo All is the MD who put him on bp med,AMLODIPINE BESYLATE and LISINOPRIL-HCTZ, he has been taking both for over a year and states they were recently denied. Pt made appt fo annual CPX for 5/29 but will need refills prior. Please advise. Initial call taken by: Verdell Face,  August 22, 2010 4:27 PM    Prescriptions: LISINOPRIL-HYDROCHLOROTHIAZIDE 10-12.5 MG TABS (LISINOPRIL-HYDROCHLOROTHIAZIDE) 1 once daily  #30 Tablet x 1   Entered by:   Margaret Pyle, CMA   Authorized by:   Minus Breeding MD   Signed by:   Margaret Pyle, CMA on 08/22/2010   Method used:   [facility] to        [facility]  [address] [phone][facility] ([facility]ail)       [address] [place], [place]  [address]       Ph: [phone] or [phone]       Fax: [phone]   RxID:   [id number] SIMVASTATIN 80 MG TABS (SIMVASTATIN) TAKE 1 at bedtime  #30.0 Tablet x 1   Entered by:   Margaret Pyle, CMA   Authorized by:   Minus Breeding MD   Signed by:   Margaret Pyle, CMA on 08/22/2010   Method used:   [facility]ron[facility]        [facility]  [address] [phone][facility] ([facility]ail)       [address] [place], [place]  [address]       Ph: [phone] or [phone]       Fax: [phone]   RxID:   [id number]

## 2010-09-15 ENCOUNTER — Other Ambulatory Visit: Payer: Self-pay | Admitting: Endocrinology

## 2010-09-15 LAB — BASIC METABOLIC PANEL
BUN: 13 mg/dL (ref 6–23)
Calcium: 9 mg/dL (ref 8.4–10.5)
Creatinine, Ser: 1 mg/dL (ref 0.4–1.5)
GFR calc non Af Amer: >60 mL/min (ref >60)

## 2010-09-15 LAB — POCT HEMOGLOBIN-HEMACUE: Hemoglobin: 16.6 g/dL (ref 13.0–17.0)

## 2010-09-17 ENCOUNTER — Telehealth: Payer: Self-pay | Admitting: Endocrinology

## 2010-09-17 NOTE — Telephone Encounter (Signed)
PA-Simvastatin Member ID :[id number]. PA form fax to [facility]

## 2010-09-24 NOTE — Telephone Encounter (Signed)
PA Approved-09/18/2010-09/18/2011. Pt informed via home VM.

## 2010-10-21 NOTE — Assessment & Plan Note (Signed)
[facility]                         GASTROENTEROLOGY OFFICE NOTE   ACY, ORSAK                      MRN:          [id number]  DATE:02/16/2007                            DOB:          09/08/1960    REFERRING PHYSICIAN:  Sean A. Everardo All, MD   REASON FOR REFERRAL:  Dr. Everardo All asked me to evaluate Jared Jimenez in  consultation regarding intermittent constipation, intermittent bright  red blood per rectum.   HPI:  Jared Jimenez is a very pleasant 50 year old man, who for the past 9-  12 months, has had intermittent constipation and straining to move his  bowels and, about once a month, he will see some bright red blood per  rectum when straining to move his bowels.  He has no diarrhea.  He  recently tried a fiber supplement, which he takes on a p.r.n. basis, and  said that definitely helps his constipation.  He has noticed no bleeding  since he started on that.  He had routine lab tests drawn about a month  ago for a physical and it showed he is not anemic and complete metabolic  profile is normal.  Thyroid testing was also normal.   REVIEW OF SYSTEMS:  Notable for stable weight, otherwise essentially  normal, and is available on his nursing intake sheet.   PAST MEDICAL HISTORY:  Hypertension, elevated cholesterol.   CURRENT MEDICATIONS:  Zocor, Norvasc.   ALLERGIES:  To BENADRYL.   SOCIAL HISTORY:  Married with two sons.  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  Drinks a couple of beers on the weekend.   FAMILY HISTORY:  No colon cancer or colon polyps in family.  His brother  had prostate cancer in his fifties.   PHYSICAL EXAM:  Height 6 feet 1 inch, 197 pounds.  Blood pressure  140/90, pulse 84.  CONSTITUTIONAL:  Generally well-appearing.  NEUROLOGIC:  Alert and oriented times three.  EYES:  Extraocular movements intact.  MOUTH:  Oropharynx moist, no lesions.  NECK:  Supple, no lymphadenopathy.  CARDIOVASCULAR/HEART:  Regular rate  and rhythm.  LUNGS:  Clear to auscultation bilaterally.  ABDOMEN:  Soft, nontender, nondistended.  Normal bowel sounds.  EXTREMITIES:  No lower extremity edema.  SKIN:  No rashes or lesions on visible extremities.   ASSESSMENT AND PLAN:  The patient is a 50 year old man with mild  constipation and intermittent bright red blood per rectum, not anemic.   I think this is likely hemorrhoidal or small fissure and my plan is to  proceed with full colonoscopy at his soonest convenience.  I see no  reason for any further blood tests or imaging studies prior to that.  I  also recommended that he continue on the fiber supplement.  It is  probably best to take it on a daily basis, rather than just p.r.n.     Rachael Fee, MD  Electronically Signed    DPJ/MedQ  DD: 02/16/2007  DT: 02/16/2007  Job #: [id number]

## 2010-10-21 NOTE — Op Note (Signed)
Jared Jimenez, Jared Jimenez               ACCOUNT NO.:  [id number]   MEDICAL RECORD NO.:  [id number]          PATIENT TYPE:  AMB   LOCATION:  [facility]                         FACILITY:  [facility]   PHYSICIAN:  Heloise Purpura, MD      DATE OF BIRTH:  [birth year]   DATE OF PROCEDURE:  11/12/2008  DATE OF DISCHARGE:                               OPERATIVE REPORT   PREOPERATIVE DIAGNOSIS:  Left ureteral calculus.   POSTOPERATIVE DIAGNOSIS:  Left ureteral calculus.   PROCEDURES:  1. Cystoscopy.  2. Left retrograde pyelography.  3. Left ureteroscopy with laser lithotripsy and stone removal.   SURGEON:  Dr. Heloise Purpura   ANESTHESIA:  General.   COMPLICATIONS:  None.   INDICATION:  Mr. [name] is a 50 year old gentleman with a history of a  distal left ureteral calculus.  He was initially treated with shock wave  lithotripsy and on follow-up ultrasonography did not have  hydronephrosis.  In addition, he was free of any symptoms.  However, on  his KUB imaging, he did appear to have a concerning calcification in the  distal ureter consistent with a probable stone.  Based on the fact that  he had did not appear to be obstructed and was asymptomatic, this was  followed.  He subsequently developed recurrent left-sided flank pain and  lower urinary tract symptoms consistent with a distal stone.  He  underwent repeat evaluation which confirmed this finding on CT imaging.  After discussing options, he elected to proceed with the above  procedures.  The potential risks, complications, and alternative  treatment options were discussed in detail and informed consent was  obtained.   DESCRIPTION OF PROCEDURE:  The patient was taken to the operating room  and a general anesthetic was administered.  He was given preoperative  antibiotics, placed in the dorsal lithotomy position, and prepped and  draped in the usual sterile fashion.  Next a preoperative time-out was  performed.  Cystourethroscopy was then  performed which demonstrated a  normal urethra.  Inspection of the bladder revealed mild edema around  left ureteral orifice with no other concerning findings within the  bladder.  Specifically, there were no tumors, stones, or other mucosal  pathology identified.  Attention was then turned to the left ureteral  orifice which was intubated with a 6-French ureteral catheter.  Omnipaque contrast was injected which demonstrated a relatively normal  caliber ureter albeit with a filling defect in the distal left ureter  consistent with his stone.  A 0.038 sensor guidewire was then advanced  through the ureteral catheter up into the left renal pelvis under  fluoroscopic guidance.  The short semi-rigid ureteroscope was then  advanced into the distal left ureter and the patient's ureteral stone  was identified.  This was then fragmented into a few pieces with the 365  micron holmium laser fiber.  The stones were then removed with a zero  tip basket.  Repeat ureteroscopy revealed no remaining stone fragments  and based on the fact that the patient's ureter did not require dilation  and there was minimal trauma, it was felt appropriate  to leave the  patient without a  ureteral stent.  Therefore, the wire was removed.  His bladder was  emptied.  The procedure was then ended.  He tolerated the procedure well  without complications.  He was able to be awakened and transferred to  the recovery unit in satisfactory condition.      Heloise Purpura, MD  Electronically Signed     LB/MEDQ  D:  11/12/2008  T:  11/12/2008  Job:  [id number]

## 2010-10-31 ENCOUNTER — Other Ambulatory Visit (INDEPENDENT_AMBULATORY_CARE_PROVIDER_SITE_OTHER): Payer: BC Managed Care – PPO

## 2010-10-31 DIAGNOSIS — Z0000 Encounter for general adult medical examination without abnormal findings: Secondary | ICD-10-CM

## 2010-10-31 DIAGNOSIS — Z0389 Encounter for observation for other suspected diseases and conditions ruled out: Secondary | ICD-10-CM

## 2010-10-31 LAB — HEPATIC FUNCTION PANEL
ALT: 19 U/L (ref 0–53)
AST: 17 U/L (ref 0–37)
Alkaline Phosphatase: 73 U/L (ref 39–117)
Total Bilirubin: 1.1 mg/dL (ref 0.3–1.2)

## 2010-10-31 LAB — LIPID PANEL
LDL Cholesterol: 84 mg/dL (ref 0–99)
Total CHOL/HDL Ratio: 4
VLDL: 23.4 mg/dL (ref 0.0–40.0)

## 2010-10-31 LAB — CBC WITH DIFFERENTIAL/PLATELET
Basophils Relative: 0.6 % (ref 0.0–3.0)
Eosinophils Relative: 2.6 % (ref 0.0–5.0)
HCT: 44.9 % (ref 39.0–52.0)
Hemoglobin: 15.6 g/dL (ref 13.0–17.0)
Lymphs Abs: 2.1 10*3/uL (ref 0.7–4.0)
Monocytes Relative: 10.8 % (ref 3.0–12.0)
Neutro Abs: 4.3 10*3/uL (ref 1.4–7.7)
Platelets: 242 10*3/uL (ref 150.0–400.0)
RBC: 4.8 Mil/uL (ref 4.22–5.81)
WBC: 7.4 10*3/uL (ref 4.5–10.5)

## 2010-10-31 LAB — PSA: PSA: 0.61 ng/mL (ref 0.10–4.00)

## 2010-10-31 LAB — URINALYSIS
Nitrite: NEGATIVE
Total Protein, Urine: NEGATIVE
Urine Glucose: NEGATIVE
pH: 6 (ref 5.0–8.0)

## 2010-10-31 LAB — BASIC METABOLIC PANEL
Chloride: 102 mEq/L (ref 96–112)
GFR: 73.85 mL/min (ref >60.00)
Potassium: 4.3 mEq/L (ref 3.5–5.1)
Sodium: 138 mEq/L (ref 135–145)

## 2010-11-04 ENCOUNTER — Encounter: Payer: Self-pay | Admitting: Endocrinology

## 2010-11-04 ENCOUNTER — Ambulatory Visit (INDEPENDENT_AMBULATORY_CARE_PROVIDER_SITE_OTHER): Payer: BC Managed Care – PPO | Admitting: Endocrinology

## 2010-11-04 VITALS — BP 122/86 | HR 76 | Temp 98.5°F | Ht 73.0 in | Wt 198.2 lb

## 2010-11-04 DIAGNOSIS — I10 Essential (primary) hypertension: Secondary | ICD-10-CM

## 2010-11-04 MED ORDER — LISINOPRIL-HYDROCHLOROTHIAZIDE 10-12.5 MG PO TABS: ORAL_TABLET | ORAL | Status: DC

## 2010-11-04 MED ORDER — SIMVASTATIN 80 MG PO TABS: 80.0000 mg | ORAL_TABLET | Freq: Every day | ORAL | Status: DC

## 2010-11-04 NOTE — Progress Notes (Signed)
Subjective:    Patient ID: Jared Jimenez, male    DOB: 1960-07-28, 50 y.o.   MRN: [id number]  HPI here for regular wellness examination.  He's feeling pretty well in general, and says chronic med probs are stable, except as noted below Past Medical History  Diagnosis Date  . DYSLIPIDEMIA 08/25/2007  . ANEMIA DUE TO CHRONIC BLOOD LOSS 05/15/2009  . HYPERTENSION 05/15/2009  . RECTAL BLEEDING 08/25/2007  . Palpitations 07/04/2009  . UNSPECIFIED URINARY CALCULUS 05/15/2009    No past surgical history on file.  History   Social History  . Marital Status: Married    Spouse Name: N/A    Number of Children: N/A  . Years of Education: N/A   Occupational History  . Electrical engineer    Social History Main Topics  . Smoking status: Never Smoker   . Smokeless tobacco: Not on file  . Alcohol Use: Yes     1-2/week  . Drug Use:   . Sexually Active:    Other Topics Concern  . Not on file   Social History Narrative  . No narrative on file    Current Outpatient Prescriptions on File Prior to Visit  Medication Sig Dispense Refill  . lisinopril-hydrochlorothiazide (PRINZIDE,ZESTORETIC) 10-12.5 MG per tablet TAKE 1 TABLET BY MOUTH EVERY DAY  30 tablet  1  . Olopatadine HCl (PATADAY) 0.2 % SOLN 1 drop in each each eye as needed for itch and irritation       . simvastatin (ZOCOR) 80 MG tablet Take 80 mg by mouth at bedtime.          Allergies  Allergen Reactions  . Diphenhydramine Hcl     REACTION: Swelling in hans, rash    Family History  Problem Relation Age of Onset  . Cancer Brother     Prostate Cancer    There were no vitals taken for this visit.      Review of Systems  Constitutional: Negative for fever and unexpected weight change.  HENT: Negative for hearing loss.   Eyes: Negative for visual disturbance.  Respiratory: Negative for shortness of breath.   Cardiovascular: Negative for chest pain.  Gastrointestinal: Negative for anal bleeding.  Genitourinary:  Negative for hematuria.       Mild intermittent decreased urinary stream  Musculoskeletal: Negative for arthralgias.  Skin: Negative for rash.  Neurological: Negative for headaches.  Hematological: Does not bruise/bleed easily.  Psychiatric/Behavioral: Negative for dysphoric mood. The patient is not nervous/anxious.        Objective:   Physical Exam VS: see vs page GEN: no distress HEAD: head: no deformity eyes: no periorbital swelling, no proptosis external nose and ears are normal mouth: no lesion seen NECK: supple, thyroid is not enlarged CHEST WALL: no deformity LUNGS: clear to a BREASTS:  No gynecomastia ABD: abdomen is soft, nontender.  no hepatosplenomegaly.  not distended.  no hernia RECTAL: normal external and internal exam.  heme neg. PROSTATE:  Normal size.  No nodule MUSCULOSKELETAL: muscle bulk and strength are grossly normal.  no obvious joint swelling.  gait is normal and steady EXTEMITIES: no deformity.  no ulcer on the feet.  feet are of normal color and temp.  no edema PULSES: dorsalis pedis intact bilat.  no carotid bruit NEURO:  cn 2-12 grossly intact.   readily moves all 4's.  sensation is intact to touch on the feet SKIN:  Normal texture and temperature.  No rash or suspicious lesion is visible.   NODES:  None  palpable at the neck PSYCH: alert, oriented x3.  Does not appear anxious nor depressed.        Assessment & Plan:  Wellness visit today, with problems stable, except as noted.    SEPARATE EVALUATION FOLLOWS--EACH PROBLEM HERE IS NEW, NOT RESPONDING TO TREATMENT, OR POSES SIGNIFICANT RISK TO THE PATIENT'S HEALTH: HISTORY OF THE PRESENT ILLNESS: Pt states 3 mos if slight dizziness in the head, in the context of bending over. PAST MEDICAL HISTORY reviewed and up to date today REVIEW OF SYSTEMS: Denies loc PHYSICAL EXAMINATION: LUNGS:  Clear to auscultation See vs page HEART: Regular rate and rhythm without murmurs noted. Normal S1,S2.     LAB/XRAY RESULTS: reviewed IMPRESSION: Dizziness, possibly due to overcontrol of htn PLAN: See instruction page

## 2010-11-04 NOTE — Patient Instructions (Addendum)
Reduce lisinopril-hct to 1/2 tab daily. please consider these measures for your health:  minimize alcohol.  do not use tobacco products.  have a colonoscopy at least every 10 years from age 50.  keep firearms safely stored.  always use seat belts.  have working smoke alarms in your home.  see an eye doctor and dentist regularly.  never drive under the influence of alcohol or drugs (including prescription drugs).  those with fair skin should take precautions against the sun. Please return in 1 year.

## 2010-11-21 ENCOUNTER — Other Ambulatory Visit: Payer: Self-pay | Admitting: Endocrinology

## 2011-01-20 ENCOUNTER — Other Ambulatory Visit: Payer: Self-pay | Admitting: *Deleted

## 2011-01-20 MED ORDER — SIMVASTATIN 80 MG PO TABS: 80.0000 mg | ORAL_TABLET | Freq: Every day | ORAL | Status: DC

## 2011-01-20 MED ORDER — LISINOPRIL-HYDROCHLOROTHIAZIDE 10-12.5 MG PO TABS: ORAL_TABLET | ORAL | Status: DC

## 2011-01-20 NOTE — Telephone Encounter (Signed)
R'cd fax from [facility] for refill of Simvastatin  Last OV-11/04/2010  Last filled-11/21/2010

## 2011-01-20 NOTE — Telephone Encounter (Signed)
R'cd fax from [facility] for refill of Lisinopril-HCTZ  Last OV-11/04/2010  Last filled-12/21/2010

## 2011-02-05 ENCOUNTER — Telehealth: Payer: Self-pay | Admitting: Endocrinology

## 2011-02-05 NOTE — Telephone Encounter (Deleted)
[facility] [place] [facility].

## 2011-02-05 NOTE — Telephone Encounter (Signed)
Pharmacy change

## 2011-02-17 ENCOUNTER — Other Ambulatory Visit: Payer: Self-pay | Admitting: *Deleted

## 2011-02-17 MED ORDER — LISINOPRIL-HYDROCHLOROTHIAZIDE 10-12.5 MG PO TABS: ORAL_TABLET | ORAL | Status: DC

## 2011-02-17 MED ORDER — SIMVASTATIN 80 MG PO TABS: 80.0000 mg | ORAL_TABLET | Freq: Every day | ORAL | Status: DC

## 2011-02-17 NOTE — Telephone Encounter (Signed)
Pt wants refills sent to [facility]

## 2011-09-02 ENCOUNTER — Encounter: Payer: BC Managed Care – PPO | Admitting: Endocrinology

## 2011-09-16 ENCOUNTER — Telehealth: Payer: Self-pay | Admitting: *Deleted

## 2011-09-16 DIAGNOSIS — Z0389 Encounter for observation for other suspected diseases and conditions ruled out: Secondary | ICD-10-CM

## 2011-09-16 DIAGNOSIS — Z0000 Encounter for general adult medical examination without abnormal findings: Secondary | ICD-10-CM

## 2011-09-16 NOTE — Telephone Encounter (Signed)
Labs entered into Epic for upcoming appointment.

## 2011-09-16 NOTE — Telephone Encounter (Signed)
Message copied by Carin Primrose on Wed Sep 16, 2011  3:22 PM ------      Message from: Newell Coral      Created: Thu Aug 27, 2011  3:09 PM       The pt scheduled and cpe and is hoping to get labs done before hand.  Thanks!

## 2011-10-26 ENCOUNTER — Other Ambulatory Visit (INDEPENDENT_AMBULATORY_CARE_PROVIDER_SITE_OTHER): Payer: BC Managed Care – PPO

## 2011-10-26 DIAGNOSIS — Z0389 Encounter for observation for other suspected diseases and conditions ruled out: Secondary | ICD-10-CM

## 2011-10-26 DIAGNOSIS — Z0000 Encounter for general adult medical examination without abnormal findings: Secondary | ICD-10-CM

## 2011-10-26 LAB — HEPATIC FUNCTION PANEL
Albumin: 4.2 g/dL (ref 3.5–5.2)
Bilirubin, Direct: 0.1 mg/dL (ref 0.0–0.3)
Total Protein: 6.8 g/dL (ref 6.0–8.3)

## 2011-10-26 LAB — BASIC METABOLIC PANEL
BUN: 22 mg/dL (ref 6–23)
CO2: 27 mEq/L (ref 19–32)
Calcium: 9.3 mg/dL (ref 8.4–10.5)
Creatinine, Ser: 1 mg/dL (ref 0.4–1.5)

## 2011-10-26 LAB — CBC WITH DIFFERENTIAL/PLATELET
Basophils Absolute: 0 10*3/uL (ref 0.0–0.1)
Basophils Relative: 0.4 % (ref 0.0–3.0)
HCT: 45.9 % (ref 39.0–52.0)
Hemoglobin: 15.5 g/dL (ref 13.0–17.0)
Lymphs Abs: 2.1 10*3/uL (ref 0.7–4.0)
MCHC: 33.7 g/dL (ref 30.0–36.0)
Monocytes Relative: 8.9 % (ref 3.0–12.0)
Neutro Abs: 3.9 10*3/uL (ref 1.4–7.7)
RDW: 12.8 % (ref 11.5–14.6)

## 2011-10-26 LAB — LIPID PANEL
Cholesterol: 147 mg/dL (ref 0–200)
LDL Cholesterol: 90 mg/dL (ref 0–99)
Triglycerides: 98 mg/dL (ref 0.0–149.0)

## 2011-10-26 LAB — URINALYSIS, ROUTINE W REFLEX MICROSCOPIC
Ketones, ur: NEGATIVE
Specific Gravity, Urine: 1.02 (ref 1.000–1.030)
Total Protein, Urine: NEGATIVE
Urine Glucose: NEGATIVE
Urobilinogen, UA: 0.2 (ref 0.0–1.0)

## 2011-10-27 LAB — TSH: TSH: 1.29 u[IU]/mL (ref 0.35–5.50)

## 2011-10-27 LAB — PSA: PSA: 0.71 ng/mL (ref 0.10–4.00)

## 2011-11-05 ENCOUNTER — Encounter: Payer: Self-pay | Admitting: Endocrinology

## 2011-11-05 ENCOUNTER — Ambulatory Visit (INDEPENDENT_AMBULATORY_CARE_PROVIDER_SITE_OTHER): Payer: PRIVATE HEALTH INSURANCE | Admitting: Endocrinology

## 2011-11-05 VITALS — BP 108/72 | HR 72 | Temp 98.5°F | Ht 73.0 in | Wt 202.0 lb

## 2011-11-05 DIAGNOSIS — Z0000 Encounter for general adult medical examination without abnormal findings: Secondary | ICD-10-CM

## 2011-11-05 DIAGNOSIS — N320 Bladder-neck obstruction: Secondary | ICD-10-CM

## 2011-11-05 DIAGNOSIS — I10 Essential (primary) hypertension: Secondary | ICD-10-CM

## 2011-11-05 MED ORDER — TAMSULOSIN HCL 0.4 MG PO CAPS: 0.4000 mg | ORAL_CAPSULE | Freq: Every day | ORAL | Status: DC

## 2011-11-05 NOTE — Patient Instructions (Addendum)
i have sent a prescription to your pharmacy, for a pill for the urination. please consider these measures for your health:  minimize alcohol.  do not use tobacco products.  have a colonoscopy at least every 10 years from age 51.  keep firearms safely stored.  always use seat belts.  have working smoke alarms in your home.  see an eye doctor and dentist regularly.  never drive under the influence of alcohol or drugs (including prescription drugs).  those with fair skin should take precautions against the sun.  Please return in 1 year.

## 2011-11-05 NOTE — Progress Notes (Signed)
Subjective:    Patient ID: Jared Jimenez, male    DOB: 14-Sep-1960, 51 y.o.   MRN: [id number]  HPI here for regular wellness examination.  He's feeling pretty well in general, and says chronic med probs are stable, except as noted below.  Pt states intermittent decreased urinary stream.   Past Medical History  Diagnosis Date  . DYSLIPIDEMIA 08/25/2007  . ANEMIA DUE TO CHRONIC BLOOD LOSS 05/15/2009  . HYPERTENSION 05/15/2009  . RECTAL BLEEDING 08/25/2007  . Palpitations 07/04/2009  . UNSPECIFIED URINARY CALCULUS 05/15/2009   No past surgical history on file.  History   Social History  . Marital Status: Married    Spouse Name: N/A    Number of Children: N/A  . Years of Education: N/A   Occupational History  . Electrical engineer    Social History Main Topics  . Smoking status: Never Smoker   . Smokeless tobacco: Not on file  . Alcohol Use: Yes     1-2/week  . Drug Use:   . Sexually Active:    Other Topics Concern  . Not on file   Social History Narrative  . No narrative on file    Current Outpatient Prescriptions on File Prior to Visit  Medication Sig Dispense Refill  . lisinopril-hydrochlorothiazide (PRINZIDE,ZESTORETIC) 10-12.5 MG per tablet Take 1/2 tablet by mouth once daily  45 tablet  2  . simvastatin (ZOCOR) 80 MG tablet Take 1 tablet (80 mg total) by mouth at bedtime.  90 tablet  2    Allergies  Allergen Reactions  . Diphenhydramine Hcl     REACTION: Swelling in hans, rash    Family History  Problem Relation Age of Onset  . Cancer Brother     Prostate Cancer    BP 108/72  Pulse 72  Temp(Src) 98.5 F (36.9 C) (Oral)  Ht 6\' 1"  (1.854 m)  Wt 202 lb (91.627 kg)  BMI 26.65 kg/m2  SpO2 96%   Review of Systems  Constitutional: Negative for fever and unexpected weight change.  HENT: Negative for hearing loss.   Eyes: Negative for visual disturbance.  Respiratory: Negative for shortness of breath.   Cardiovascular: Negative for chest pain.    Gastrointestinal: Negative for anal bleeding.  Genitourinary: Negative for hematuria.  Musculoskeletal: Positive for back pain.  Skin: Negative for rash.  Neurological: Negative for syncope and numbness.  Hematological: Does not bruise/bleed easily.  Psychiatric/Behavioral: Negative for dysphoric mood.      Objective:   Physical Exam VS: see vs page GEN: no distress HEAD: head: no deformity eyes: no periorbital swelling, no proptosis external nose and ears are normal mouth: no lesion seen NECK: supple, thyroid is not enlarged CHEST WALL: no deformity LUNGS: clear to auscultation BREASTS:  No gynecomastia CV: reg rate and rhythm, no murmur ABD: abdomen is soft, nontender.  no hepatosplenomegaly.  not distended.  no hernia RECTAL: normal external and internal exam.  heme neg. PROSTATE:  Normal size.  No nodule MUSCULOSKELETAL: muscle bulk and strength are grossly normal.  no obvious joint swelling.  gait is normal and steady EXTEMITIES: no deformity.  no ulcer on the feet.  feet are of normal color and temp.  no edema PULSES: dorsalis pedis intact bilat.  no carotid bruit NEURO:  cn 2-12 grossly intact.   readily moves all 4's.  sensation is intact to touch on the feet SKIN:  Normal texture and temperature.  No rash or suspicious lesion is visible.   NODES:  None palpable at  the neck.   PSYCH: alert, oriented x3.  Does not appear anxious nor depressed.     Assessment & Plan:  Wellness visit today, with problems stable, except as noted. Mild prostatism.

## 2011-11-07 DIAGNOSIS — N320 Bladder-neck obstruction: Secondary | ICD-10-CM | POA: Insufficient documentation

## 2011-11-07 DIAGNOSIS — Z0000 Encounter for general adult medical examination without abnormal findings: Secondary | ICD-10-CM | POA: Insufficient documentation

## 2011-12-03 ENCOUNTER — Other Ambulatory Visit: Payer: Self-pay | Admitting: Endocrinology

## 2012-03-08 ENCOUNTER — Telehealth: Payer: Self-pay

## 2012-03-08 MED ORDER — ALFUZOSIN HCL ER 10 MG PO TB24: 10.0000 mg | ORAL_TABLET | Freq: Every day | ORAL | Status: DC

## 2012-03-08 NOTE — Telephone Encounter (Signed)
Yes, this is a side-effect.  i have sent a different prescription to your pharmacy.  copay will prob be higher.

## 2012-03-08 NOTE — Telephone Encounter (Signed)
Pt called stating that he has noticed that when he takes Flomax, it causes sinus congestion. Pt is requesting an alternative medication.

## 2012-03-08 NOTE — Telephone Encounter (Signed)
Left msg for pt to call. 

## 2012-03-09 ENCOUNTER — Telehealth: Payer: Self-pay | Admitting: Endocrinology

## 2012-03-09 NOTE — Telephone Encounter (Signed)
Pt.notified

## 2012-03-09 NOTE — Telephone Encounter (Signed)
Duplicate phone note.

## 2012-03-09 NOTE — Telephone Encounter (Signed)
Pt is currently taking Flomax, but it clogs his sinuses. He is wondering if he can get another rx that won't do this?

## 2012-07-04 ENCOUNTER — Telehealth: Payer: Self-pay | Admitting: Endocrinology

## 2012-07-04 MED ORDER — LISINOPRIL-HYDROCHLOROTHIAZIDE 10-12.5 MG PO TABS: 1.0000 | ORAL_TABLET | Freq: Every day | ORAL | Status: DC

## 2012-07-04 MED ORDER — SIMVASTATIN 80 MG PO TABS: 80.0000 mg | ORAL_TABLET | ORAL | Status: DC

## 2012-07-04 MED ORDER — ALFUZOSIN HCL ER 10 MG PO TB24: 10.0000 mg | ORAL_TABLET | Freq: Every day | ORAL | Status: DC

## 2012-07-04 NOTE — Telephone Encounter (Signed)
The patient called to request [facility] [place] [facility]. The patient may be reached at [phone] if needed.

## 2012-11-07 ENCOUNTER — Other Ambulatory Visit: Payer: Self-pay

## 2012-11-07 ENCOUNTER — Ambulatory Visit: Payer: BLUE CROSS/BLUE SHIELD

## 2012-11-07 DIAGNOSIS — Z0000 Encounter for general adult medical examination without abnormal findings: Secondary | ICD-10-CM

## 2012-11-07 LAB — CBC WITH DIFFERENTIAL/PLATELET
Basophils Absolute: 0 10*3/uL (ref 0.0–0.1)
Basophils Relative: 0.4 % (ref 0.0–3.0)
Hemoglobin: 15.5 g/dL (ref 13.0–17.0)
Lymphocytes Relative: 29.8 % (ref 12.0–46.0)
Monocytes Relative: 10 % (ref 3.0–12.0)
Neutro Abs: 4.2 10*3/uL (ref 1.4–7.7)
Neutrophils Relative %: 56.7 % (ref 43.0–77.0)
RBC: 4.86 Mil/uL (ref 4.22–5.81)

## 2012-11-07 LAB — HEPATIC FUNCTION PANEL
AST: 20 U/L (ref 0–37)
Albumin: 4.2 g/dL (ref 3.5–5.2)
Alkaline Phosphatase: 69 U/L (ref 39–117)
Bilirubin, Direct: 0.1 mg/dL (ref 0.0–0.3)

## 2012-11-07 LAB — URINALYSIS
Bilirubin Urine: NEGATIVE
Leukocytes, UA: NEGATIVE
Nitrite: NEGATIVE
Specific Gravity, Urine: <=1.005 (ref 1.000–1.030)
Total Protein, Urine: NEGATIVE
pH: 6 (ref 5.0–8.0)

## 2012-11-07 LAB — BASIC METABOLIC PANEL
BUN: 16 mg/dL (ref 6–23)
CO2: 26 mEq/L (ref 19–32)
Calcium: 9 mg/dL (ref 8.4–10.5)
Chloride: 103 mEq/L (ref 96–112)
Creatinine, Ser: 1.1 mg/dL (ref 0.4–1.5)

## 2012-11-07 LAB — LIPID PANEL: Cholesterol: 142 mg/dL (ref 0–200)

## 2012-11-07 LAB — PSA: PSA: 0.57 ng/mL (ref 0.10–4.00)

## 2012-11-08 ENCOUNTER — Encounter: Payer: PRIVATE HEALTH INSURANCE | Admitting: Endocrinology

## 2012-11-14 ENCOUNTER — Encounter: Payer: Self-pay | Admitting: Endocrinology

## 2012-11-17 ENCOUNTER — Ambulatory Visit (INDEPENDENT_AMBULATORY_CARE_PROVIDER_SITE_OTHER): Payer: BLUE CROSS/BLUE SHIELD | Admitting: Endocrinology

## 2012-11-17 ENCOUNTER — Encounter: Payer: Self-pay | Admitting: Endocrinology

## 2012-11-17 VITALS — BP 122/70 | HR 80 | Ht 73.0 in | Wt 199.0 lb

## 2012-11-17 DIAGNOSIS — Z0000 Encounter for general adult medical examination without abnormal findings: Secondary | ICD-10-CM

## 2012-11-17 MED ORDER — FLUOCINONIDE 0.05 % EX CREA: TOPICAL_CREAM | Freq: Three times a day (TID) | CUTANEOUS | Status: DC

## 2012-11-17 NOTE — Progress Notes (Signed)
Subjective:    Patient ID: Jared Jimenez, male    DOB: 15-Nov-1960, 52 y.o.   MRN: [id number]  HPI Pt is here for regular wellness examination.  He says chronic med probs are stable, except as noted below.  He has slight rash on the right ankle.  He wants to know if glucose is going low, as he has episodes of fatigue a few hrs after eating.  Past Medical History  Diagnosis Date  . DYSLIPIDEMIA 08/25/2007  . ANEMIA DUE TO CHRONIC BLOOD LOSS 05/15/2009  . HYPERTENSION 05/15/2009  . RECTAL BLEEDING 08/25/2007  . Palpitations 07/04/2009  . UNSPECIFIED URINARY CALCULUS 05/15/2009    No past surgical history on file.  History   Social History  . Marital Status: Married    Spouse Name: N/A    Number of Children: N/A  . Years of Education: N/A   Occupational History  . Electrical engineer    Social History Main Topics  . Smoking status: Never Smoker   . Smokeless tobacco: Not on file  . Alcohol Use: Yes     Comment: 1-2/week  . Drug Use:   . Sexually Active:    Other Topics Concern  . Not on file   Social History Narrative  . No narrative on file    Current Outpatient Prescriptions on File Prior to Visit  Medication Sig Dispense Refill  . alfuzosin (UROXATRAL) 10 MG 24 hr tablet Take 1 tablet (10 mg total) by mouth daily.  90 tablet  3  . lisinopril-hydrochlorothiazide (PRINZIDE,ZESTORETIC) 10-12.5 MG per tablet Take 1 tablet by mouth daily.  45 tablet  3  . simvastatin (ZOCOR) 80 MG tablet Take 1 tablet (80 mg total) by mouth as directed.  90 tablet  3   No current facility-administered medications on file prior to visit.    Allergies  Allergen Reactions  . Diphenhydramine Hcl     REACTION: Swelling in hans, rash    Family History  Problem Relation Age of Onset  . Cancer Brother     Prostate Cancer    BP 122/70  Pulse 80  Ht 6\' 1"  (1.854 m)  Wt 199 lb (90.266 kg)  BMI 26.26 kg/m2  SpO2 98%     Review of Systems  Constitutional: Negative for fever and  unexpected weight change.  HENT: Negative for hearing loss.   Eyes: Negative for visual disturbance.  Respiratory: Negative for shortness of breath.   Cardiovascular: Negative for chest pain.  Gastrointestinal: Negative for anal bleeding.  Endocrine: Negative for cold intolerance.  Genitourinary: Negative for hematuria and difficulty urinating.  Musculoskeletal: Negative for back pain.  Skin: Negative for rash.  Allergic/Immunologic: Positive for environmental allergies.  Neurological: Negative for syncope and numbness.  Hematological: Does not bruise/bleed easily.  Psychiatric/Behavioral: Negative for dysphoric mood.       Objective:   Physical Exam VS: see vs page GEN: no distress HEAD: head: no deformity eyes: no periorbital swelling, no proptosis external nose and ears are normal mouth: no lesion seen NECK: supple, thyroid is not enlarged CHEST WALL: no deformity LUNGS: clear to auscultation BREASTS:  No gynecomastia CV: reg rate and rhythm, no murmur ABD: abdomen is soft, nontender.  no hepatosplenomegaly.  not distended.  no hernia GENITALIA:  Normal male.   RECTAL: normal external and internal exam.  heme neg. PROSTATE:  Normal size.  No nodule MUSCULOSKELETAL: muscle bulk and strength are grossly normal.  no obvious joint swelling.  gait is normal and steady EXTEMITIES: no  deformity.  no ulcer on the feet.  feet are of normal color and temp.  no edema PULSES: dorsalis pedis intact bilat.  no carotid bruit NEURO:  cn 2-12 grossly intact.   readily moves all 4's.  sensation is intact to touch on the feet SKIN:  Normal texture and temperature.  No rash or suspicious lesion is visible.  Mild dermatitis on the right lateral malleolus  NODES:  None palpable at the neck PSYCH: alert, oriented x3.  Does not appear anxious nor depressed.      Assessment & Plan:  Wellness visit today, with problems stable, except as noted.

## 2012-11-17 NOTE — Patient Instructions (Addendum)
please consider these measures for your health:  minimize alcohol.  do not use tobacco products.  have a colonoscopy at least every 10 years from age 52.  keep firearms safely stored.  always use seat belts.  have working smoke alarms in your home.  see an eye doctor and dentist regularly.  never drive under the influence of alcohol or drugs (including prescription drugs).  those with fair skin should take precautions against the sun.  Please return in 1 year.   i have sent a prescription to your pharmacy, for a skin cream Loratadine-d (non-prescription) will help your ear. Here is a blood sugar meter.  Call if it is below 40.

## 2013-02-13 ENCOUNTER — Ambulatory Visit (INDEPENDENT_AMBULATORY_CARE_PROVIDER_SITE_OTHER): Payer: BC Managed Care – PPO | Admitting: Endocrinology

## 2013-02-13 ENCOUNTER — Encounter: Payer: Self-pay | Admitting: Endocrinology

## 2013-02-13 VITALS — BP 140/70 | HR 104 | Ht 73.0 in | Wt 199.0 lb

## 2013-02-13 DIAGNOSIS — R42 Dizziness and giddiness: Secondary | ICD-10-CM

## 2013-02-13 NOTE — Patient Instructions (Addendum)
I hope you feel better soon.  If you don't feel better by next week, please call back.  Please call sooner if you get worse.

## 2013-02-13 NOTE — Progress Notes (Signed)
  Subjective:    Patient ID: Jared Jimenez, male    DOB: August 12, 1960, 52 y.o.   MRN: [id number]  HPI 2 days ago, pt awoke with severe vertigenous-quality dizziness, and assoc nausea.  sxs improved over the course of the day, and is now 85% better.  He has never had this before.  No visual loss, numbness, or fever. Past Medical History  Diagnosis Date  . DYSLIPIDEMIA 08/25/2007  . ANEMIA DUE TO CHRONIC BLOOD LOSS 05/15/2009  . HYPERTENSION 05/15/2009  . RECTAL BLEEDING 08/25/2007  . Palpitations 07/04/2009  . UNSPECIFIED URINARY CALCULUS 05/15/2009    No past surgical history on file.  History   Social History  . Marital Status: Married    Spouse Name: N/A    Number of Children: N/A  . Years of Education: N/A   Occupational History  . Electrical engineer    Social History Main Topics  . Smoking status: Never Smoker   . Smokeless tobacco: Not on file  . Alcohol Use: Yes     Comment: 1-2/week  . Drug Use:   . Sexual Activity:    Other Topics Concern  . Not on file   Social History Narrative  . No narrative on file    Current Outpatient Prescriptions on File Prior to Visit  Medication Sig Dispense Refill  . alfuzosin (UROXATRAL) 10 MG 24 hr tablet Take 1 tablet (10 mg total) by mouth daily.  90 tablet  3  . fluocinonide cream (LIDEX) 0.05 % Apply topically 3 (three) times daily. As needed for rash  60 g  2  . lisinopril-hydrochlorothiazide (PRINZIDE,ZESTORETIC) 10-12.5 MG per tablet Take 1 tablet by mouth daily.  45 tablet  3  . simvastatin (ZOCOR) 80 MG tablet Take 1 tablet (80 mg total) by mouth as directed.  90 tablet  3   No current facility-administered medications on file prior to visit.   Allergies  Allergen Reactions  . Diphenhydramine Hcl     REACTION: Swelling in hans, rash   Family History  Problem Relation Age of Onset  . Cancer Brother     Prostate Cancer   BP 140/70  Pulse 104  Ht 6\' 1"  (1.854 m)  Wt 199 lb (90.266 kg)  BMI 26.26 kg/m2  SpO2  97%  Review of Systems denies LOC and headache.      Objective:   Physical Exam VITAL SIGNS:  See vs page GENERAL: no distress CN are intact bilaterally Gait: normal and steady  i reviewed electrocardiogram    Assessment & Plan:  Dizziness, new, uncertain etiology.  Vertigo is most likely, but there are many possible causes.   HTN: with possible situational component.

## 2013-02-17 ENCOUNTER — Other Ambulatory Visit: Payer: Self-pay | Admitting: Endocrinology

## 2013-03-13 ENCOUNTER — Other Ambulatory Visit: Payer: Self-pay | Admitting: Endocrinology

## 2013-03-15 ENCOUNTER — Other Ambulatory Visit: Payer: Self-pay | Admitting: Endocrinology

## 2013-03-15 ENCOUNTER — Telehealth: Payer: Self-pay | Admitting: Endocrinology

## 2013-03-15 DIAGNOSIS — R42 Dizziness and giddiness: Secondary | ICD-10-CM

## 2013-03-15 NOTE — Telephone Encounter (Signed)
Pt advised.

## 2013-03-15 NOTE — Telephone Encounter (Signed)
Ok, i ordered 

## 2013-03-20 ENCOUNTER — Ambulatory Visit (INDEPENDENT_AMBULATORY_CARE_PROVIDER_SITE_OTHER)
Admission: RE | Admit: 2013-03-20 | Discharge: 2013-03-20 | Disposition: A | Discharge status: Home / Self Care | Payer: BC Managed Care – PPO | Source: Ambulatory Visit | Attending: Endocrinology | Admitting: Endocrinology

## 2013-03-20 DIAGNOSIS — R42 Dizziness and giddiness: Secondary | ICD-10-CM

## 2013-04-13 ENCOUNTER — Other Ambulatory Visit: Payer: Self-pay

## 2013-07-16 ENCOUNTER — Other Ambulatory Visit: Payer: Self-pay | Admitting: Endocrinology

## 2013-08-05 ENCOUNTER — Other Ambulatory Visit: Payer: Self-pay | Admitting: Endocrinology

## 2013-09-07 ENCOUNTER — Other Ambulatory Visit: Payer: Self-pay | Admitting: Endocrinology

## 2014-01-10 ENCOUNTER — Other Ambulatory Visit: Payer: Self-pay | Admitting: Endocrinology

## 2014-01-22 ENCOUNTER — Ambulatory Visit (INDEPENDENT_AMBULATORY_CARE_PROVIDER_SITE_OTHER): Payer: Managed Care, Other (non HMO) | Admitting: Endocrinology

## 2014-01-22 ENCOUNTER — Encounter: Payer: Self-pay | Admitting: Endocrinology

## 2014-01-22 VITALS — BP 122/76 | HR 83 | Temp 98.2°F | Ht 73.0 in | Wt 209.0 lb

## 2014-01-22 DIAGNOSIS — E785 Hyperlipidemia, unspecified: Secondary | ICD-10-CM

## 2014-01-22 DIAGNOSIS — N320 Bladder-neck obstruction: Secondary | ICD-10-CM

## 2014-01-22 DIAGNOSIS — Z0000 Encounter for general adult medical examination without abnormal findings: Secondary | ICD-10-CM

## 2014-01-22 DIAGNOSIS — N209 Urinary calculus, unspecified: Secondary | ICD-10-CM

## 2014-01-22 DIAGNOSIS — I10 Essential (primary) hypertension: Secondary | ICD-10-CM

## 2014-01-22 LAB — URINALYSIS, ROUTINE W REFLEX MICROSCOPIC
BILIRUBIN URINE: NEGATIVE
Hgb urine dipstick: NEGATIVE
Ketones, ur: NEGATIVE
Leukocytes, UA: NEGATIVE
NITRITE: NEGATIVE
PH: 5.5 (ref 5.0–8.0)
Specific Gravity, Urine: 1.015 (ref 1.000–1.030)
TOTAL PROTEIN, URINE-UPE24: NEGATIVE
Urine Glucose: NEGATIVE
Urobilinogen, UA: 0.2 (ref 0.0–1.0)

## 2014-01-22 LAB — CBC WITH DIFFERENTIAL/PLATELET
BASOS ABS: 0 10*3/uL (ref 0.0–0.1)
Basophils Relative: 0.4 % (ref 0.0–3.0)
Eosinophils Absolute: 0.1 10*3/uL (ref 0.0–0.7)
Eosinophils Relative: 1.7 % (ref 0.0–5.0)
HCT: 43.9 % (ref 39.0–52.0)
Hemoglobin: 15 g/dL (ref 13.0–17.0)
LYMPHS PCT: 30.5 % (ref 12.0–46.0)
Lymphs Abs: 2.2 10*3/uL (ref 0.7–4.0)
MCHC: 34.2 g/dL (ref 30.0–36.0)
MCV: 91.8 fl (ref 78.0–100.0)
MONOS PCT: 9.4 % (ref 3.0–12.0)
Monocytes Absolute: 0.7 10*3/uL (ref 0.1–1.0)
Neutro Abs: 4.1 10*3/uL (ref 1.4–7.7)
Neutrophils Relative %: 58 % (ref 43.0–77.0)
PLATELETS: 224 10*3/uL (ref 150.0–400.0)
RBC: 4.78 Mil/uL (ref 4.22–5.81)
RDW: 12.6 % (ref 11.5–15.5)
WBC: 7.1 10*3/uL (ref 4.0–10.5)

## 2014-01-22 LAB — LIPID PANEL
CHOL/HDL RATIO: 4
Cholesterol: 139 mg/dL (ref 0–200)
HDL: 35.1 mg/dL — AB (ref >39.00)
LDL Cholesterol: 67 mg/dL (ref 0–99)
NonHDL: 103.9
Triglycerides: 187 mg/dL — ABNORMAL HIGH (ref 0.0–149.0)
VLDL: 37.4 mg/dL (ref 0.0–40.0)

## 2014-01-22 LAB — BASIC METABOLIC PANEL
BUN: 15 mg/dL (ref 6–23)
CHLORIDE: 105 meq/L (ref 96–112)
CO2: 23 meq/L (ref 19–32)
CREATININE: 1 mg/dL (ref 0.4–1.5)
Calcium: 9.2 mg/dL (ref 8.4–10.5)
GFR: 84.24 mL/min (ref >60.00)
Glucose, Bld: 108 mg/dL — ABNORMAL HIGH (ref 70–99)
Potassium: 3.5 mEq/L (ref 3.5–5.1)
Sodium: 138 mEq/L (ref 135–145)

## 2014-01-22 LAB — HEPATIC FUNCTION PANEL
ALT: 20 U/L (ref 0–53)
AST: 19 U/L (ref 0–37)
Albumin: 4.3 g/dL (ref 3.5–5.2)
Alkaline Phosphatase: 81 U/L (ref 39–117)
Bilirubin, Direct: 0.1 mg/dL (ref 0.0–0.3)
Total Bilirubin: 0.8 mg/dL (ref 0.2–1.2)
Total Protein: 6.8 g/dL (ref 6.0–8.3)

## 2014-01-22 MED ORDER — CLOTRIMAZOLE-BETAMETHASONE 1-0.05 % EX CREA: 1.0000 "application " | TOPICAL_CREAM | Freq: Three times a day (TID) | CUTANEOUS | Status: DC | PRN

## 2014-01-22 MED ORDER — SIMVASTATIN 80 MG PO TABS: ORAL_TABLET | ORAL | Status: DC

## 2014-01-22 MED ORDER — LISINOPRIL 20 MG PO TABS: 20.0000 mg | ORAL_TABLET | Freq: Every day | ORAL | Status: DC

## 2014-01-22 NOTE — Progress Notes (Signed)
Subjective:    Patient ID: Jared Jimenez, male    DOB: 01/19/1961, 53 y.o.   MRN: [id number]  HPI Pt is here for regular wellness examination, and is feeling pretty well in general, and says chronic med probs are stable, except as noted below.   Rash on the right foot has recurred.  Past Medical History  Diagnosis Date  . DYSLIPIDEMIA 08/25/2007  . ANEMIA DUE TO CHRONIC BLOOD LOSS 05/15/2009  . HYPERTENSION 05/15/2009  . RECTAL BLEEDING 08/25/2007  . Palpitations 07/04/2009  . UNSPECIFIED URINARY CALCULUS 05/15/2009    No past surgical history on file.  History   Social History  . Marital Status: Married    Spouse Name: N/A    Number of Children: N/A  . Years of Education: N/A   Occupational History  . Electrical engineernvestment Banker    Social History Main Topics  . Smoking status: Never Smoker   . Smokeless tobacco: Not on file  . Alcohol Use: Yes     Comment: 1-2/week  . Drug Use:   . Sexual Activity:    Other Topics Concern  . Not on file   Social History Narrative  . No narrative on file    Current Outpatient Prescriptions on File Prior to Visit  Medication Sig Dispense Refill  . alfuzosin (UROXATRAL) 10 MG 24 hr tablet TAKE 1 TABLET (10 MG TOTAL) BY MOUTH DAILY.  90 tablet  2   No current facility-administered medications on file prior to visit.    Allergies  Allergen Reactions  . Diphenhydramine Hcl     REACTION: Swelling in hans, rash    Family History  Problem Relation Age of Onset  . Cancer Brother     Prostate Cancer    BP 122/76  Pulse 83  Temp(Src) 98.2 F (36.8 C) (Oral)  Ht 6\' 1"  (1.854 m)  Wt 209 lb (94.802 kg)  BMI 27.58 kg/m2  SpO2 96%     Review of Systems  Constitutional: Negative for fever and unexpected weight change.  HENT: Negative for hearing loss.   Eyes: Negative for visual disturbance.  Respiratory: Negative for shortness of breath.   Cardiovascular: Negative for chest pain.  Gastrointestinal: Negative for anal bleeding.    Endocrine: Negative for cold intolerance.  Genitourinary: Negative for hematuria.       He reports nocturia  Musculoskeletal: Negative for back pain.  Skin: Negative for wound.  Allergic/Immunologic: Negative for environmental allergies.  Neurological: Negative for syncope.  Hematological: Does not bruise/bleed easily.  Psychiatric/Behavioral: Negative for dysphoric mood.       Objective:   Physical Exam VS: see vs page GEN: no distress HEAD: head: no deformity eyes: no periorbital swelling, no proptosis external nose and ears are normal mouth: no lesion seen NECK: supple, thyroid is not enlarged CHEST WALL: no deformity LUNGS: clear to auscultation BREASTS:  No gynecomastia CV: reg rate and rhythm, no murmur ABD: abdomen is soft, nontender.  no hepatosplenomegaly.  not distended.  no hernia RECTAL: normal external and internal exam.  heme neg. PROSTATE:  Normal size.  No nodule MUSCULOSKELETAL: muscle bulk and strength are grossly normal.  no obvious joint swelling.  gait is normal and steady EXTEMITIES: no deformity.  no ulcer on the feet.  feet are of normal color and temp.  no edema PULSES: dorsalis pedis intact bilat.  no carotid bruit NEURO:  cn 2-12 grossly intact.   readily moves all 4's.  sensation is intact to touch on the feet SKIN:  Normal texture and temperature.  No suspicious lesion is visible.  There is a slight rash on the right foot.   NODES:  None palpable at the neck PSYCH: alert, well-oriented.  Does not appear anxious nor depressed.   i reviewed electrocardiogram    Assessment & Plan:  Wellness visit today, with problems stable, except as noted. HTN: overcontrolled. Rash, recurrent   Patient is advised the following: Patient Instructions  please consider these measures for your health:  minimize alcohol.  do not use tobacco products.  have a colonoscopy at least every 10 years from age 35.  keep firearms safely stored.  always use seat belts.   have working smoke alarms in your home.  see an eye doctor and dentist regularly.  never drive under the influence of alcohol or drugs (including prescription drugs).  those with fair skin should take precautions against the sun.  Please see a urology specialist.  you will receive a phone call, about a day and time for an appointment Please return in 1 year.   i have sent a prescription to your pharmacy, for a skin cream. Please change the blood pressure pill.  i have sent a prescription to your pharmacy.

## 2014-01-22 NOTE — Patient Instructions (Addendum)
please consider these measures for your health:  minimize alcohol.  do not use tobacco products.  have a colonoscopy at least every 10 years from age 55.  keep firearms safely stored.  always use seat belts.  have working smoke alarms in your home.  see an eye doctor and dentist regularly.  never drive under the influence of alcohol or drugs (including prescription drugs).  those with fair skin should take precautions against the sun.  Please see a urology specialist.  you will receive a phone call, about a day and time for an appointment Please return in 1 year.   i have sent a prescription to your pharmacy, for a skin cream. Please change the blood pressure pill.  i have sent a prescription to your pharmacy.

## 2014-01-23 LAB — TSH: TSH: 1.31 u[IU]/mL (ref 0.35–4.50)

## 2014-01-23 LAB — PSA: PSA: 0.61 ng/mL (ref 0.10–4.00)

## 2014-01-24 ENCOUNTER — Other Ambulatory Visit: Payer: Self-pay | Admitting: Endocrinology

## 2014-02-20 ENCOUNTER — Other Ambulatory Visit: Payer: Self-pay | Admitting: Endocrinology

## 2014-02-28 ENCOUNTER — Other Ambulatory Visit: Payer: Self-pay | Admitting: Endocrinology

## 2014-05-24 ENCOUNTER — Other Ambulatory Visit: Payer: Self-pay | Admitting: Endocrinology

## 2014-07-17 ENCOUNTER — Encounter: Payer: Self-pay | Admitting: Endocrinology

## 2014-11-18 ENCOUNTER — Other Ambulatory Visit: Payer: Self-pay | Admitting: Endocrinology

## 2014-11-26 ENCOUNTER — Other Ambulatory Visit: Payer: Self-pay | Admitting: Endocrinology

## 2014-12-25 ENCOUNTER — Telehealth: Payer: Self-pay | Admitting: Endocrinology

## 2014-12-25 MED ORDER — SCOPOLAMINE 1 MG/3DAYS TD PT72: 1.0000 | MEDICATED_PATCH | TRANSDERMAL | Status: DC

## 2014-12-25 NOTE — Telephone Encounter (Signed)
See note below and please advise, Thanks! 

## 2014-12-25 NOTE — Telephone Encounter (Signed)
Error

## 2014-12-25 NOTE — Telephone Encounter (Signed)
Patient called stating that [place] be [place] and would like an Rx sent to his pharmacy   Rx: Type of patch for sea sick   [facility]    Thank you

## 2014-12-25 NOTE — Telephone Encounter (Signed)
Pt advised rx has been sent.  

## 2014-12-25 NOTE — Telephone Encounter (Signed)
i have sent a prescription to your pharmacy 

## 2014-12-27 ENCOUNTER — Telehealth: Payer: Self-pay | Admitting: *Deleted

## 2014-12-27 MED ORDER — SCOPOLAMINE 1 MG/3DAYS TD PT72: 1.0000 | MEDICATED_PATCH | TRANSDERMAL | Status: DC

## 2014-12-27 NOTE — Telephone Encounter (Signed)
Patient called about his prescriptions, he said he was in [place] for a convention and had one rx sent there.  The rx for scopolamine was sent to [facility] and he [facility] please. Please let patient know when it's done

## 2014-12-27 NOTE — Telephone Encounter (Signed)
[facility]. Pt notified.

## 2015-01-11 ENCOUNTER — Telehealth: Payer: Self-pay | Admitting: Endocrinology

## 2015-01-11 MED ORDER — LISINOPRIL 20 MG PO TABS: ORAL_TABLET | ORAL | Status: DC

## 2015-01-11 NOTE — Telephone Encounter (Signed)
Rx sent per pt's request.  

## 2015-01-11 NOTE — Telephone Encounter (Signed)
Pt needs refill on lisinopril [facility] [place] please

## 2015-01-22 ENCOUNTER — Other Ambulatory Visit: Payer: Self-pay | Admitting: Endocrinology

## 2015-03-07 ENCOUNTER — Other Ambulatory Visit: Payer: Self-pay | Admitting: Endocrinology

## 2015-03-07 NOTE — Telephone Encounter (Signed)
Please advise if ok to refill. Last office visit was 01/20/2014. Thanks! 

## 2015-03-27 ENCOUNTER — Other Ambulatory Visit: Payer: Self-pay | Admitting: Endocrinology

## 2015-04-27 ENCOUNTER — Other Ambulatory Visit: Payer: Self-pay | Admitting: Endocrinology

## 2015-06-03 ENCOUNTER — Other Ambulatory Visit: Payer: Self-pay | Admitting: Endocrinology

## 2015-06-10 ENCOUNTER — Other Ambulatory Visit: Payer: Self-pay | Admitting: Endocrinology

## 2015-06-11 ENCOUNTER — Other Ambulatory Visit: Payer: Self-pay | Admitting: Endocrinology

## 2015-07-05 ENCOUNTER — Telehealth: Payer: Self-pay | Admitting: Endocrinology

## 2015-07-05 ENCOUNTER — Other Ambulatory Visit: Payer: Self-pay | Admitting: Endocrinology

## 2015-07-05 ENCOUNTER — Other Ambulatory Visit: Payer: Self-pay

## 2015-07-05 MED ORDER — SIMVASTATIN 80 MG PO TABS: 80.0000 mg | ORAL_TABLET | Freq: Every day | ORAL | Status: DC

## 2015-07-05 NOTE — Telephone Encounter (Signed)
[facility] [place] will no refill medication simvastatin, he will like to know why

## 2015-07-05 NOTE — Telephone Encounter (Signed)
I contacted the pt and advise we have sent his medication, but for any further refills he would need to make an office visit. Requested a call back to schedule his appointment.

## 2015-07-12 ENCOUNTER — Other Ambulatory Visit: Payer: Self-pay | Admitting: Endocrinology

## 2015-07-19 ENCOUNTER — Ambulatory Visit (INDEPENDENT_AMBULATORY_CARE_PROVIDER_SITE_OTHER): Payer: BLUE CROSS/BLUE SHIELD | Admitting: Family Medicine

## 2015-07-19 ENCOUNTER — Encounter: Payer: Self-pay | Admitting: Family Medicine

## 2015-07-19 VITALS — BP 117/78 | HR 96 | Temp 98.5°F | Resp 17 | Ht 72.0 in | Wt 206.0 lb

## 2015-07-19 DIAGNOSIS — I10 Essential (primary) hypertension: Secondary | ICD-10-CM

## 2015-07-19 DIAGNOSIS — J111 Influenza due to unidentified influenza virus with other respiratory manifestations: Secondary | ICD-10-CM | POA: Diagnosis not present

## 2015-07-19 DIAGNOSIS — R69 Illness, unspecified: Principal | ICD-10-CM

## 2015-07-19 DIAGNOSIS — Z23 Encounter for immunization: Secondary | ICD-10-CM

## 2015-07-19 DIAGNOSIS — E785 Hyperlipidemia, unspecified: Secondary | ICD-10-CM

## 2015-07-19 MED ORDER — OSELTAMIVIR PHOSPHATE 75 MG PO CAPS: ORAL_CAPSULE | ORAL | Status: DC

## 2015-07-19 MED ORDER — HYDROCODONE-HOMATROPINE 5-1.5 MG/5ML PO SYRP: ORAL_SOLUTION | ORAL | Status: DC

## 2015-07-19 NOTE — Patient Instructions (Signed)
Get otc generic robitussin DM OR Mucinex DM and use as directed on the packaging for cough and congestion. Use otc generic saline nasal spray 2-3 times per day to irrigate/moisturize your nasal passages.   

## 2015-07-19 NOTE — Progress Notes (Signed)
Pre visit review using our clinic review tool, if applicable. No additional management support is needed unless otherwise documented below in the visit note. 

## 2015-07-19 NOTE — Progress Notes (Signed)
Office Note 07/19/2015  CC:  Chief Complaint  Patient presents with  . Establish Care    transfer from Dr. Everardo All  . Cough    x 1 day    HPI:  Jared Jimenez is a 55 y.o. White male who is here to transfer/establish care. Patient's most recent primary MD: Dr. Everardo All (endocrinologist). Old records in EPIC/[facility] EMR were reviewed prior to or during today's visit.  Most recent visit with Dr. Everardo All was 01/23/14. No home bp monitoring. Compliant with meds daily.  Denies side effects from these. He eats a regular [place] diet.  Tries to do some exercising at home--push ups, sit ups, etc.  Chest feels tight, lots of coughing, lots of phlegm, +nasal congestion/mucous.  The URI portion of this started 2 wks ago and the cough began yesterday.  Felt subjective fever last night.  No body aches.  Feels fatigue. Robitussin DM and motrin tried.  No flu vaccine this year.  His son had influenza-like illness earlier this week.  Past Medical History  Diagnosis Date  . DYSLIPIDEMIA 08/25/2007  . ANEMIA DUE TO CHRONIC BLOOD LOSS 05/15/2009  . HYPERTENSION 05/15/2009  . RECTAL BLEEDING 08/25/2007  . Palpitations 07/04/2009  . UNSPECIFIED URINARY CALCULUS 05/15/2009    Past Surgical History  Procedure Laterality Date  . Colonoscopy  04/08/07    normal (Dr. Drue Flirt)    Family History  Problem Relation Age of Onset  . Prostate cancer Brother   . Hyperlipidemia Brother   . Hypertension Brother   . Hyperlipidemia Mother   . Hypertension Mother   . Lung cancer Father     smoker  . Hypertension Father   . Clotting disorder Son   . Diabetes Neg Hx   . Hypertension Brother     Social History   Social History  . Marital Status: Married    Spouse Name: N/A  . Number of Children: N/A  . Years of Education: N/A   Occupational History  . Electrical engineer    Social History Main Topics  . Smoking status: Never Smoker   . Smokeless tobacco: Never Used  . Alcohol Use: Yes      Comment: 1-2/week  . Drug Use: No  . Sexual Activity: Not on file   Other Topics Concern  . Not on file   Social History Narrative   Married, 2 sons.   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   No T/A/Ds.       Outpatient Encounter Prescriptions as of 07/19/2015  Medication Sig  . alfuzosin (UROXATRAL) 10 MG 24 hr tablet TAKE 1 TABLET (10 MG TOTAL) BY MOUTH DAILY.  . clotrimazole-betamethasone (LOTRISONE) cream Apply 1 application topically 3 (three) times daily as needed. For rash  . lisinopril (PRINIVIL,ZESTRIL) 20 MG tablet TAKE 1 TABLET BY MOUTH DAILY  . simvastatin (ZOCOR) 80 MG tablet Take 1 tablet (80 mg total) by mouth daily.  Marland Kitchen HYDROcodone-homatropine (HYCODAN) 5-1.5 MG/5ML syrup 1-2 tsp po qhs prn cough  . oseltamivir (TAMIFLU) 75 MG capsule 1 tab po bid x 5d  . [DISCONTINUED] scopolamine (TRANSDERM-SCOP) 1 MG/3DAYS Place 1 patch (1.5 mg total) onto the skin every 3 (three) days. (Patient not taking: Reported on 07/19/2015)   No facility-administered encounter medications on file as of 07/19/2015.    Allergies  Allergen Reactions  . Diphenhydramine Hcl     REACTION: Swelling in hans, rash    ROS Review of Systems  Constitutional: Positive for fatigue.  HENT: Positive for congestion. Negative for sore throat.   Eyes: Negative for visual disturbance.  Respiratory: Positive for cough. Negative for shortness of breath and wheezing.   Cardiovascular: Negative for chest pain, palpitations and leg swelling.  Gastrointestinal: Negative for nausea and abdominal pain.  Genitourinary: Negative for dysuria.  Musculoskeletal: Negative for back pain and joint swelling.  Skin: Negative for rash.  Neurological: Negative for weakness and headaches.  Hematological: Negative for adenopathy.    PE; Blood pressure 117/78, pulse 96, temperature 98.5 F (36.9 C), temperature source Oral, resp. rate 17, height 6' (1.829 m), weight 206 lb (93.441  kg), SpO2 97 %.  VS: noted--normal. Gen: alert, NAD, NONTOXIC APPEARING. HEENT: eyes without injection, drainage, or swelling.  Ears: EACs clear, TMs with normal light reflex and landmarks.  Nose: Clear rhinorrhea, with some dried, crusty exudate adherent to mildly injected mucosa.  No purulent d/c.  No paranasal sinus TTP.  No facial swelling.  Throat and mouth without focal lesion.  No pharyngial swelling, erythema, or exudate.   Neck: supple, no LAD.   LUNGS: CTA bilat, nonlabored resps.   CV: RRR, no m/r/g. EXT: no c/c/e SKIN: no rash  Pertinent labs:  Lab Results  Component Value Date   TSH 1.31 01/22/2014   Lab Results  Component Value Date   WBC 7.1 01/22/2014   HGB 15.0 01/22/2014   HCT 43.9 01/22/2014   MCV 91.8 01/22/2014   PLT 224.0 01/22/2014   Lab Results  Component Value Date   CREATININE 1.0 01/22/2014   BUN 15 01/22/2014   NA 138 01/22/2014   K 3.5 01/22/2014   CL 105 01/22/2014   CO2 23 01/22/2014   Lab Results  Component Value Date   ALT 20 01/22/2014   AST 19 01/22/2014   ALKPHOS 81 01/22/2014   BILITOT 0.8 01/22/2014   Lab Results  Component Value Date   CHOL 139 01/22/2014   Lab Results  Component Value Date   HDL 35.10* 01/22/2014   Lab Results  Component Value Date   LDLCALC 67 01/22/2014   Lab Results  Component Value Date   TRIG 187.0* 01/22/2014   Lab Results  Component Value Date   CHOLHDL 4 01/22/2014   Lab Results  Component Value Date   PSA 0.61 01/22/2014   PSA 0.57 11/07/2012   PSA 0.71 10/26/2011   ASSESSMENT AND PLAN:   Transfer pt/establishing care:  1) Influenza-like illness, with family member with ILI earlier this week, pt not vacccinated for flu this season. Plan is to treat with tamiflu  bid x 5d.   Get otc generic robitussin DM OR Mucinex DM and use as directed on the packaging for cough and congestion. Use otc generic saline nasal spray 2-3 times per day to irrigate/moisturize your nasal  passages. Hycodan syrup, 1-2 tsp po qhs prn cough, #120 ml.  2) Essential HTN: The current medical regimen is effective;  continue present plan and medications. He has appt soon with me for CPE so we'll get labs at that time.  3) Hyperlipidemia; The current medical regimen is effective;  continue present plan and medications. See #2 above regarding plan for labs.  An After Visit Summary was printed and given to the patient  Return for keep appt already scheduled for your CPE (fasting). Needs DRE and PSA at that time as well.

## 2015-08-06 ENCOUNTER — Ambulatory Visit (INDEPENDENT_AMBULATORY_CARE_PROVIDER_SITE_OTHER): Payer: BLUE CROSS/BLUE SHIELD | Admitting: Family Medicine

## 2015-08-06 ENCOUNTER — Encounter: Payer: Self-pay | Admitting: Family Medicine

## 2015-08-06 VITALS — BP 108/75 | HR 83 | Temp 98.2°F | Resp 16 | Ht 72.0 in | Wt 202.2 lb

## 2015-08-06 DIAGNOSIS — Z125 Encounter for screening for malignant neoplasm of prostate: Secondary | ICD-10-CM | POA: Diagnosis not present

## 2015-08-06 DIAGNOSIS — Z0000 Encounter for general adult medical examination without abnormal findings: Secondary | ICD-10-CM | POA: Diagnosis not present

## 2015-08-06 LAB — CBC WITH DIFFERENTIAL/PLATELET
Basophils Absolute: 0.1 10*3/uL (ref 0.0–0.1)
Basophils Relative: 0.8 % (ref 0.0–3.0)
EOS ABS: 0.2 10*3/uL (ref 0.0–0.7)
Eosinophils Relative: 2.3 % (ref 0.0–5.0)
HEMATOCRIT: 47.2 % (ref 39.0–52.0)
HEMOGLOBIN: 15.9 g/dL (ref 13.0–17.0)
LYMPHS ABS: 2.5 10*3/uL (ref 0.7–4.0)
Lymphocytes Relative: 30.2 % (ref 12.0–46.0)
MCHC: 33.7 g/dL (ref 30.0–36.0)
MCV: 92.1 fl (ref 78.0–100.0)
MONO ABS: 0.6 10*3/uL (ref 0.1–1.0)
Monocytes Relative: 7.4 % (ref 3.0–12.0)
Neutro Abs: 5 10*3/uL (ref 1.4–7.7)
Neutrophils Relative %: 59.3 % (ref 43.0–77.0)
Platelets: 248 10*3/uL (ref 150.0–400.0)
RBC: 5.13 Mil/uL (ref 4.22–5.81)
RDW: 12.9 % (ref 11.5–15.5)
WBC: 8.4 10*3/uL (ref 4.0–10.5)

## 2015-08-06 LAB — COMPREHENSIVE METABOLIC PANEL
ALBUMIN: 4.8 g/dL (ref 3.5–5.2)
ALT: 18 U/L (ref 0–53)
AST: 14 U/L (ref 0–37)
Alkaline Phosphatase: 84 U/L (ref 39–117)
BUN: 17 mg/dL (ref 6–23)
CALCIUM: 9.6 mg/dL (ref 8.4–10.5)
CHLORIDE: 107 meq/L (ref 96–112)
CO2: 26 mEq/L (ref 19–32)
Creatinine, Ser: 1.06 mg/dL (ref 0.40–1.50)
GFR: 76.52 mL/min (ref >60.00)
Glucose, Bld: 103 mg/dL — ABNORMAL HIGH (ref 70–99)
POTASSIUM: 4.2 meq/L (ref 3.5–5.1)
Sodium: 141 mEq/L (ref 135–145)
Total Bilirubin: 0.8 mg/dL (ref 0.2–1.2)
Total Protein: 7.3 g/dL (ref 6.0–8.3)

## 2015-08-06 LAB — LIPID PANEL
CHOL/HDL RATIO: 4
CHOLESTEROL: 145 mg/dL (ref 0–200)
HDL: 36.3 mg/dL — AB (ref >39.00)
LDL CALC: 81 mg/dL (ref 0–99)
NonHDL: 109.05
TRIGLYCERIDES: 142 mg/dL (ref 0.0–149.0)
VLDL: 28.4 mg/dL (ref 0.0–40.0)

## 2015-08-06 LAB — TSH: TSH: 1.32 u[IU]/mL (ref 0.35–4.50)

## 2015-08-06 LAB — PSA: PSA: 0.81 ng/mL (ref 0.10–4.00)

## 2015-08-06 MED ORDER — LISINOPRIL 20 MG PO TABS: 20.0000 mg | ORAL_TABLET | Freq: Every day | ORAL | Status: DC

## 2015-08-06 MED ORDER — SIMVASTATIN 80 MG PO TABS: 80.0000 mg | ORAL_TABLET | Freq: Every day | ORAL | Status: DC

## 2015-08-06 NOTE — Progress Notes (Signed)
Pre visit review using our clinic review tool, if applicable. No additional management support is needed unless otherwise documented below in the visit note. 

## 2015-08-06 NOTE — Progress Notes (Signed)
Office Note 08/06/2015  CC:  Chief Complaint  Patient presents with  . Annual Exam    Pt is fasting.     HPI:  Jared Jimenez is a 55 y.o. White male who is here for annual health maintenance exam. Feeling well. Eye exam UTD this year--no problems except new rx for myopia. Dental preventative exams being done regularly.  Says he is eating a pretty good diet b/c he is trying to lose a few lbs. 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  Past Medical History  Diagnosis Date  . DYSLIPIDEMIA 08/25/2007  . ANEMIA DUE TO CHRONIC BLOOD LOSS 05/15/2009  . HYPERTENSION 05/15/2009  . RECTAL BLEEDING 08/25/2007  . Palpitations 07/04/2009  . UNSPECIFIED URINARY CALCULUS 05/15/2009    Dr. Laverle Patter  . BPH with obstruction/lower urinary tract symptoms     Past Surgical History  Procedure Laterality Date  . Colonoscopy  04/08/07    normal (Dr. Drue Flirt)    Family History  Problem Relation Age of Onset  . Prostate cancer Brother   . Hyperlipidemia Brother   . Hypertension Brother   . Hyperlipidemia Mother   . Hypertension Mother   . Lung cancer Father     smoker  . Hypertension Father   . Clotting disorder Son   . Diabetes Neg Hx   . Hypertension Brother     Social History   Social History  . Marital Status: Married    Spouse Name: N/A  . Number of Children: N/A  . Years of Education: N/A   Occupational History  . Electrical engineer    Social History Main Topics  . Smoking status: Never Smoker   . Smokeless tobacco: Never Used  . Alcohol Use: Yes     Comment: 1-2/week  . Drug Use: No  . Sexual Activity: Not on file   Other Topics Concern  . Not on file   Social History Narrative   Married, 2 sons.   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   No T/A/Ds.       Outpatient Prescriptions Prior to Visit  Medication Sig Dispense Refill   . alfuzosin (UROXATRAL) 10 MG 24 hr tablet TAKE 1 TABLET (10 MG TOTAL) BY MOUTH DAILY. 90 tablet 2  . clotrimazole-betamethasone (LOTRISONE) cream Apply 1 application topically 3 (three) times daily as needed. For rash 45 g 2  . lisinopril (PRINIVIL,ZESTRIL) 20 MG tablet TAKE 1 TABLET BY MOUTH DAILY 90 tablet 0  . simvastatin (ZOCOR) 80 MG tablet Take 1 tablet (80 mg total) by mouth daily. 90 tablet 0  . HYDROcodone-homatropine (HYCODAN) 5-1.5 MG/5ML syrup 1-2 tsp po qhs prn cough (Patient not taking: Reported on 08/06/2015) 120 mL 0  . oseltamivir (TAMIFLU) 75 MG capsule 1 tab po bid x 5d (Patient not taking: Reported on 08/06/2015) 10 capsule 0   No facility-administered medications prior to visit.    Allergies  Allergen Reactions  . Diphenhydramine Hcl     REACTION: Swelling in hans, rash    ROS Review of Systems  Constitutional: Negative for fever, chills, appetite change and fatigue.  HENT: Negative for congestion, dental problem, ear pain and sore throat.   Eyes: Negative for discharge, redness and visual disturbance.  Respiratory: Negative for cough, chest tightness, shortness of breath and wheezing.   Cardiovascular: Negative for chest pain, palpitations and leg swelling.  Gastrointestinal: Negative for nausea,  vomiting, abdominal pain, diarrhea and blood in stool.  Genitourinary: Negative for dysuria, urgency, frequency, hematuria, flank pain and difficulty urinating.  Musculoskeletal: Negative for myalgias, back pain, joint swelling, arthralgias and neck stiffness.  Skin: Negative for pallor and rash.  Neurological: Negative for dizziness, speech difficulty, weakness and headaches.  Hematological: Negative for adenopathy. Does not bruise/bleed easily.  Psychiatric/Behavioral: Negative for confusion and sleep disturbance. The patient is not nervous/anxious.     PE; Blood pressure 108/75, pulse 83, temperature 98.2 F (36.8 C), temperature source Other (Comment), resp. rate  16, height 6' (1.829 m), weight 202 lb 4 oz (91.74 kg), SpO2 94 %. Gen: Alert, well appearing.  Patient is oriented to person, place, time, and situation. AFFECT: pleasant, lucid thought and speech. ENT: Ears: EACs clear, normal epithelium.  TMs with good light reflex and landmarks bilaterally.  Eyes: no injection, icteris, swelling, or exudate.  EOMI, PERRLA. Nose: no drainage or turbinate edema/swelling.  No injection or focal lesion.  Mouth: lips without lesion/swelling.  Oral mucosa pink and moist.  Dentition intact and without obvious caries or gingival swelling.  Oropharynx without erythema, exudate, or swelling.  Neck: supple/nontender.  No LAD, mass, or TM.  Carotid pulses 2+ bilaterally, without bruits. CV: RRR, no m/r/g.   LUNGS: CTA bilat, nonlabored resps, good aeration in all lung fields. ABD: soft, NT, ND, BS normal.  No hepatospenomegaly or mass.  No bruits. EXT: no clubbing, cyanosis, or edema.  Musculoskeletal: no joint swelling, erythema, warmth, or tenderness.  ROM of all joints intact. Skin - no sores or suspicious lesions or rashes or color changes Rectal exam: negative without mass, lesions or tenderness, PROSTATE EXAM: smooth and symmetric without nodules or tenderness.   Pertinent labs:  Lab Results  Component Value Date   TSH 1.31 01/22/2014   Lab Results  Component Value Date   WBC 7.1 01/22/2014   HGB 15.0 01/22/2014   HCT 43.9 01/22/2014   MCV 91.8 01/22/2014   PLT 224.0 01/22/2014   Lab Results  Component Value Date   CREATININE 1.0 01/22/2014   BUN 15 01/22/2014   NA 138 01/22/2014   K 3.5 01/22/2014   CL 105 01/22/2014   CO2 23 01/22/2014   Lab Results  Component Value Date   ALT 20 01/22/2014   AST 19 01/22/2014   ALKPHOS 81 01/22/2014   BILITOT 0.8 01/22/2014   Lab Results  Component Value Date   CHOL 139 01/22/2014   Lab Results  Component Value Date   HDL 35.10* 01/22/2014   Lab Results  Component Value Date   LDLCALC 67  01/22/2014   Lab Results  Component Value Date   TRIG 187.0* 01/22/2014   Lab Results  Component Value Date   CHOLHDL 4 01/22/2014   Lab Results  Component Value Date   PSA 0.61 01/22/2014   PSA 0.57 11/07/2012   PSA 0.71 10/26/2011   ASSESSMENT AND PLAN:   Health maintenance exam: Reviewed age and gender appropriate health maintenance issues (prudent diet, regular exercise, health risks of tobacco and excessive alcohol, use of seatbelts, fire alarms in home, use of sunscreen).  Also reviewed age and gender appropriate health screening as well as vaccine recommendations. Vaccines UTD.  He declines Hep C and HIV screening. Fasting HP labs drawn today. DRE normal, PSA drawn. Next colonoscopy due 04/2017.  An After Visit Summary was printed and given to the patient.  FOLLOW UP:  Return in about 6 months (around 02/03/2016) for routine chronic illness  f/u.  Signed:  Santiago Bumpers, MD           08/06/2015

## 2015-09-10 IMAGING — CT CT HEAD W/O CM
1 series · 16 of 30 positions shown, 20 images · non-contrast
Comparison: None.

CLINICAL DATA: Dizziness and giddiness

EXAM:
CT HEAD WITHOUT CONTRAST
TECHNIQUE: Contiguous axial images were obtained from the base of the skull
through the vertex without intravenous contrast.

[Series 2: head_seq -c 4.5 h37s st · axial · 0.47mm/px · z∈[-125,+19]mm · 16 of 36 slices shown, 20 images]
[im 2/36  brain]
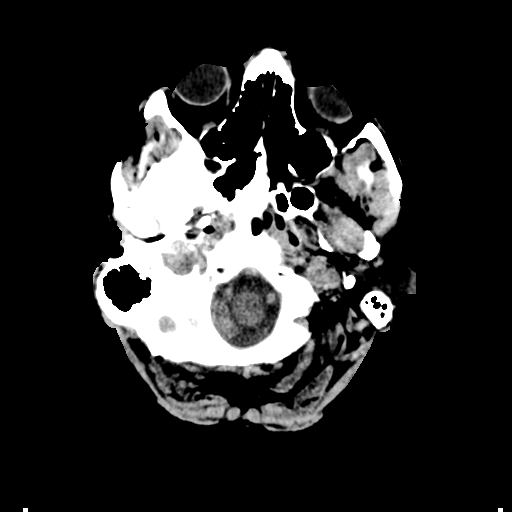
[im 2/36  bone]
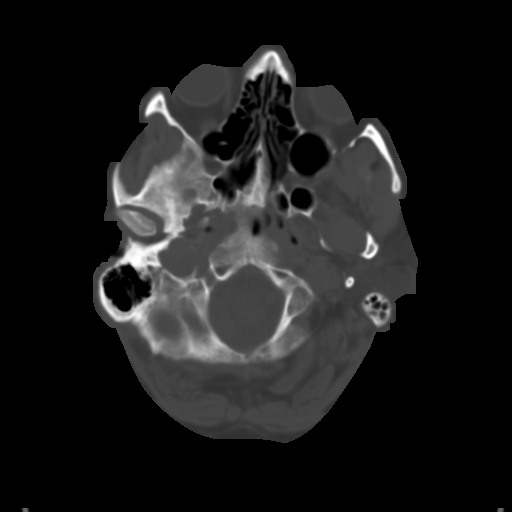
[im 4/36  brain]
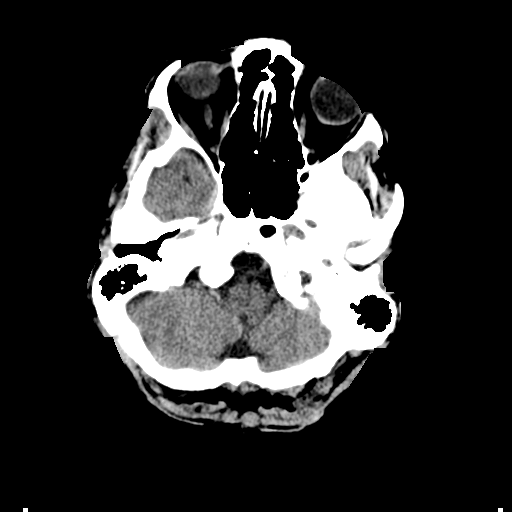
[im 7/36  brain]
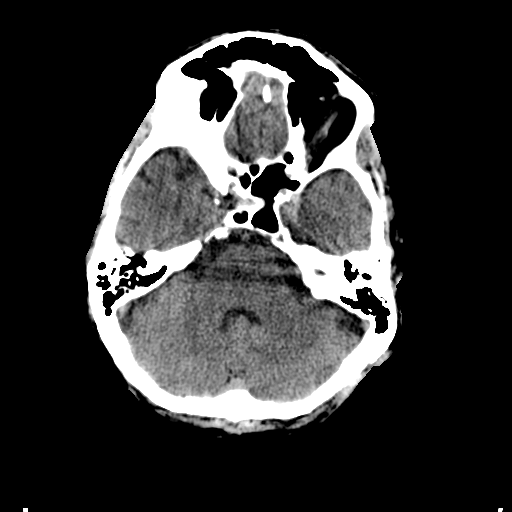
[im 9/36  brain]
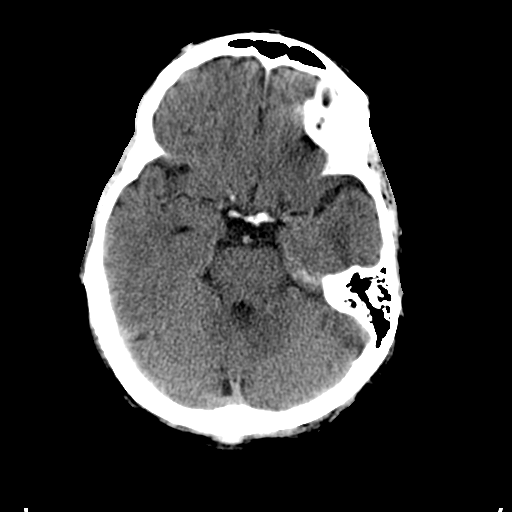
[im 10/36  brain]
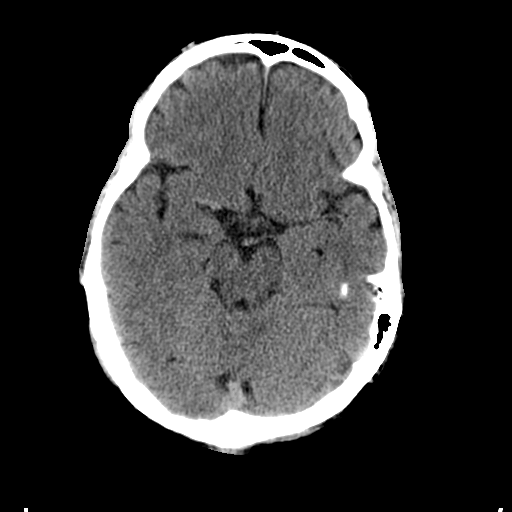
[im 10/36  bone]
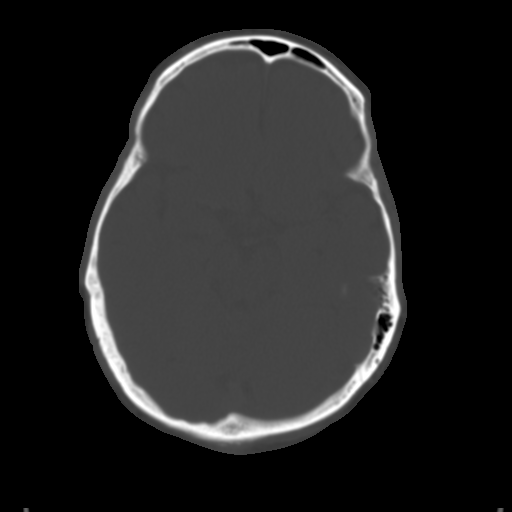
[im 13/36  brain]
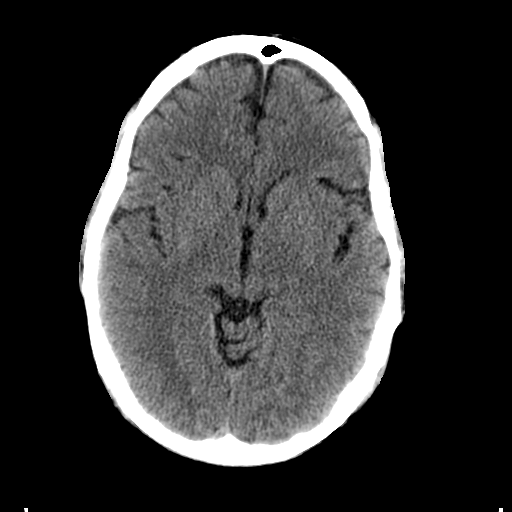
[im 15/36  brain]
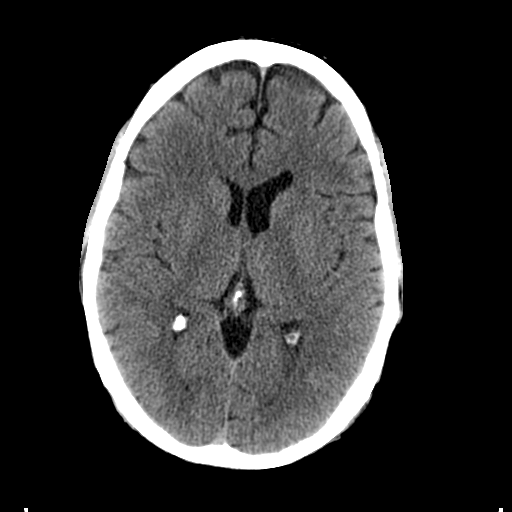
[im 17/36  brain]
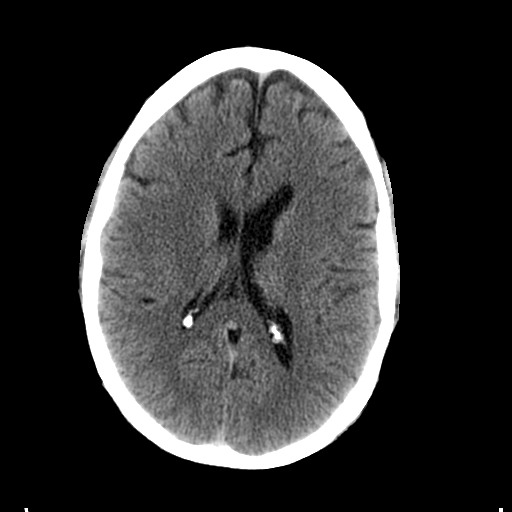
[im 19/36  brain]
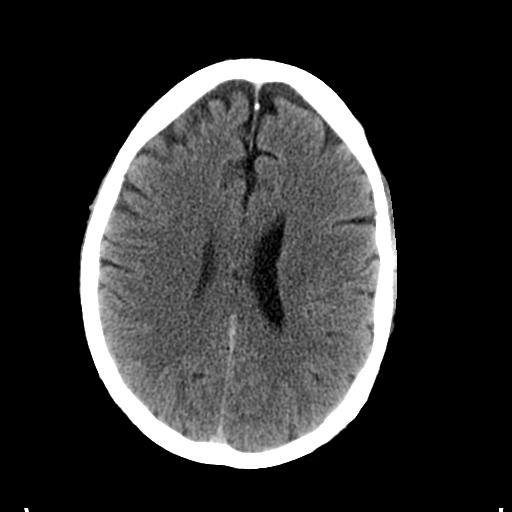
[im 19/36  bone]
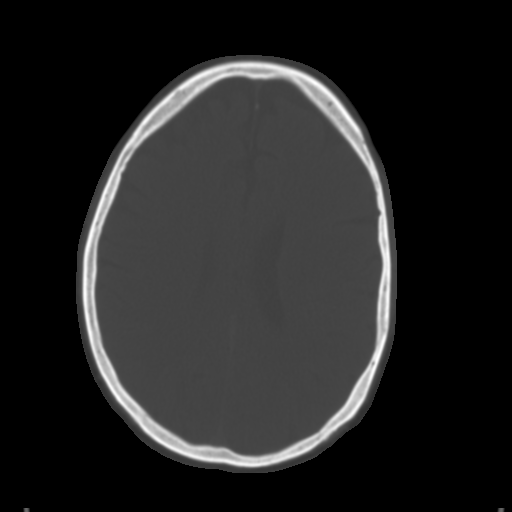
[im 21/36  brain]
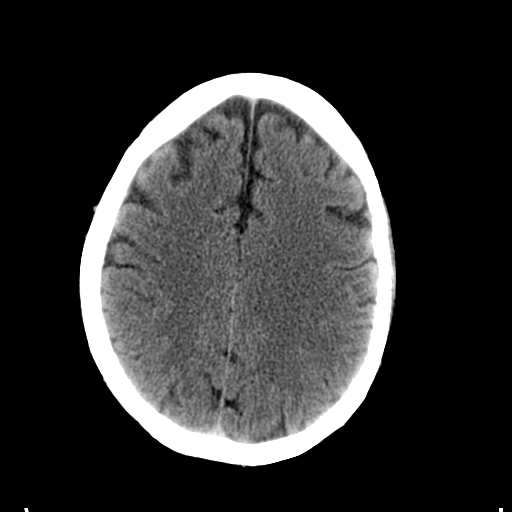
[im 23/36  brain]
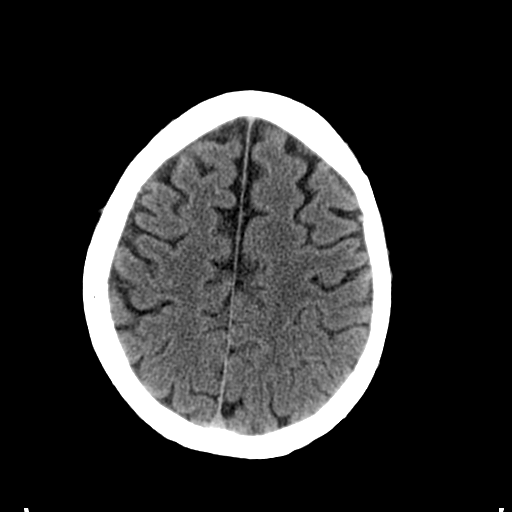
[im 26/36  brain]
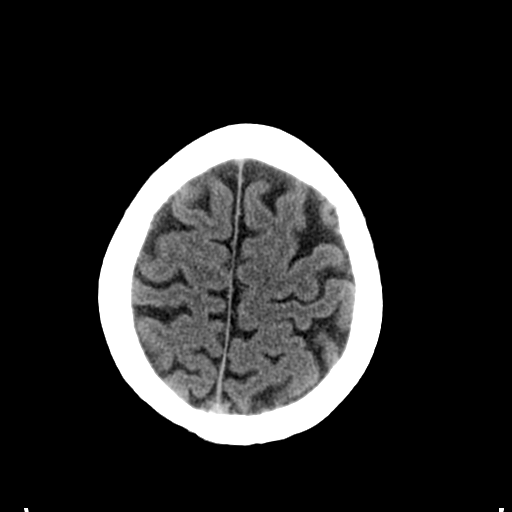
[im 27/36  brain]
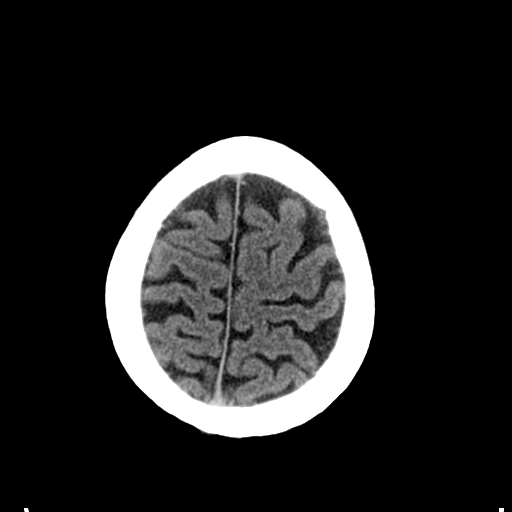
[im 27/36  bone]
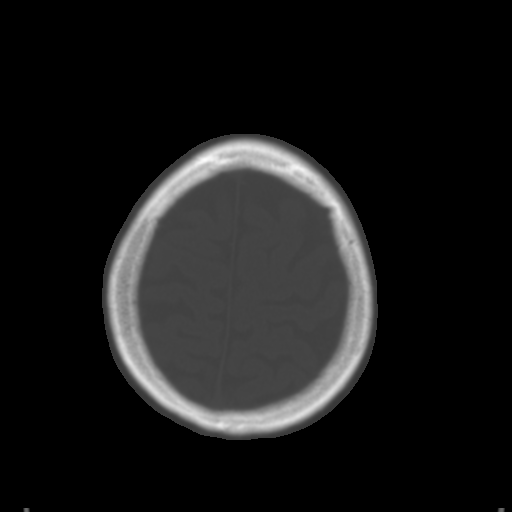
[im 29/36  brain]
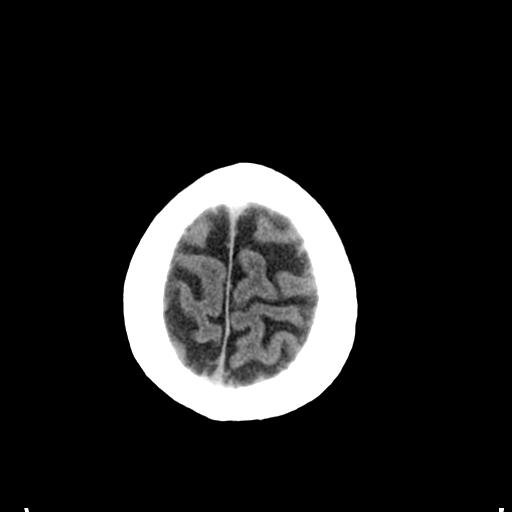
[im 32/36  brain]
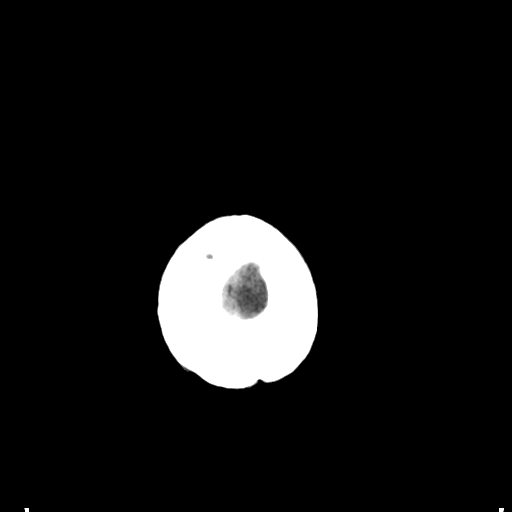
[im 34/36  brain]
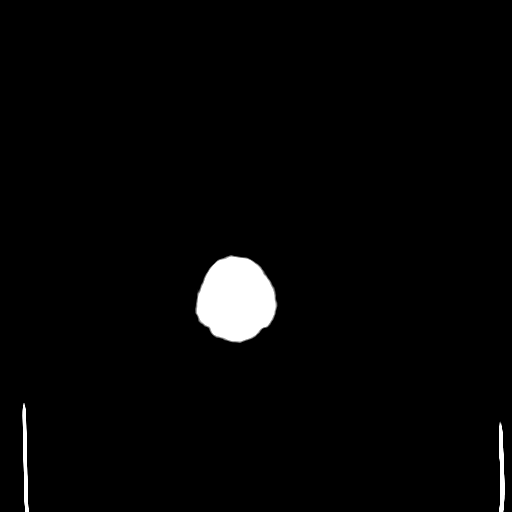

[16 of 30 positions shown; findings below may reference images not displayed]

FINDINGS: Ventricle size is normal. Cerebral volume normal for age.

Negative for intracranial hemorrhage. No mass or edema or midline
shift. Calvarium is intact. Visualized sinuses are clear.
IMPRESSION: Negative

## 2015-10-11 ENCOUNTER — Other Ambulatory Visit: Payer: Self-pay | Admitting: Endocrinology

## 2015-10-19 ENCOUNTER — Other Ambulatory Visit: Payer: Self-pay | Admitting: Endocrinology

## 2015-12-20 ENCOUNTER — Ambulatory Visit (INDEPENDENT_AMBULATORY_CARE_PROVIDER_SITE_OTHER): Payer: BLUE CROSS/BLUE SHIELD | Admitting: Family Medicine

## 2015-12-20 ENCOUNTER — Encounter: Payer: Self-pay | Admitting: Family Medicine

## 2015-12-20 VITALS — BP 126/77 | HR 121 | Temp 101.2°F | Resp 16 | Ht 72.0 in | Wt 205.5 lb

## 2015-12-20 DIAGNOSIS — J029 Acute pharyngitis, unspecified: Secondary | ICD-10-CM

## 2015-12-20 DIAGNOSIS — J069 Acute upper respiratory infection, unspecified: Secondary | ICD-10-CM | POA: Diagnosis not present

## 2015-12-20 DIAGNOSIS — B9789 Other viral agents as the cause of diseases classified elsewhere: Secondary | ICD-10-CM

## 2015-12-20 LAB — POCT RAPID STREP A (OFFICE): Rapid Strep A Screen: NEGATIVE

## 2015-12-20 MED ORDER — HYDROCODONE-HOMATROPINE 5-1.5 MG/5ML PO SYRP: ORAL_SOLUTION | ORAL | Status: DC

## 2015-12-20 NOTE — Progress Notes (Signed)
Pre visit review using our clinic review tool, if applicable. No additional management support is needed unless otherwise documented below in the visit note. 

## 2015-12-20 NOTE — Progress Notes (Signed)
OFFICE VISIT  12/20/2015   CC:  Chief Complaint  Patient presents with  . Sore Throat    x 2 days, body aches, fever but did not take   HPI:    Patient is a 55 y.o.  male who presents for sore throat. Onset about 3 d/a, progressively worsening ST, achy all over body but no HA.  Subjective fever starting yesterday.   Nasal congestion/runny nose as well, also with terrible cough.  No rash.  Denies SOB or wheezing. Tried mucinex DM and motrin. No known sick contacts. No n/v.  Appetite is down.  No diarrhea.  Past Medical History  Diagnosis Date  . DYSLIPIDEMIA 08/25/2007  . ANEMIA DUE TO CHRONIC BLOOD LOSS 05/15/2009  . HYPERTENSION 05/15/2009  . RECTAL BLEEDING 08/25/2007  . Palpitations 07/04/2009  . UNSPECIFIED URINARY CALCULUS 05/15/2009    Dr. Laverle PatterBorden  . BPH with obstruction/lower urinary tract symptoms     Past Surgical History  Procedure Laterality Date  . Colonoscopy  04/08/07    normal (Dr. Drue FlirtJacobs-Carrabelle)    Outpatient Prescriptions Prior to Visit  Medication Sig Dispense Refill  . alfuzosin (UROXATRAL) 10 MG 24 hr tablet TAKE 1 TABLET (10 MG TOTAL) BY MOUTH DAILY. 90 tablet 2  . lisinopril (PRINIVIL,ZESTRIL) 20 MG tablet Take 1 tablet (20 mg total) by mouth daily. 90 tablet 3  . simvastatin (ZOCOR) 80 MG tablet Take 1 tablet (80 mg total) by mouth daily. 90 tablet 3  . clotrimazole-betamethasone (LOTRISONE) cream Apply 1 application topically 3 (three) times daily as needed. For rash (Patient not taking: Reported on 12/20/2015) 45 g 2   No facility-administered medications prior to visit.    Allergies  Allergen Reactions  . Diphenhydramine Hcl     REACTION: Swelling in hans, rash    ROS As per HPI  PE: Blood pressure 126/77, pulse 121, temperature 101.2 F (38.4 C), temperature source Oral, resp. rate 16, height 6' (1.829 m), weight 205 lb 8 oz (93.214 kg), SpO2 94 %. VS: noted--normal. Gen: alert, NAD, NONTOXIC APPEARING. HEENT: eyes without injection,  drainage, or swelling.  Ears: EACs clear, TMs with normal light reflex and landmarks.  Nose: Clear rhinorrhea, with some dried, crusty exudate adherent to mildly injected mucosa.  No purulent d/c.  No paranasal sinus TTP.  No facial swelling.  Throat and mouth without focal lesion.  Mild diffuse soft palate and tonsillar swelling, erythema.  No exudate but some PND noted. Neck: supple, no LAD.   LUNGS: CTA bilat, nonlabored resps.   CV: Regular, tachycardic, no m/r/g. EXT: no c/c/e SKIN: no rash    LABS:  Rapid strep today was negative  IMPRESSION AND PLAN:  Acute URI with ST and cough. This does look and sound more viral than bacterial. Will send group A strep culture, though. Symptomatic care at this time: hycodan syrup 1-2 tsp tid prn, #120 ml. Push fluids, rest.  An After Visit Summary was printed and given to the patient.  FOLLOW UP: Return if symptoms worsen or fail to improve.  Signed:  Santiago BumpersPhil McGowen, MD           12/20/2015

## 2015-12-20 NOTE — Addendum Note (Signed)
Addended by: Smitty KnudsenSUTHERLAND, Cyndy Braver K on: 12/20/2015 10:06 AM   Modules accepted: Kipp BroodSmartSet

## 2015-12-22 LAB — CULTURE, GROUP A STREP

## 2015-12-23 ENCOUNTER — Other Ambulatory Visit: Payer: Self-pay | Admitting: Endocrinology

## 2016-08-10 ENCOUNTER — Other Ambulatory Visit: Payer: Self-pay

## 2016-08-10 MED ORDER — SIMVASTATIN 80 MG PO TABS: 80.0000 mg | ORAL_TABLET | Freq: Every day | ORAL | 0 refills | Status: DC

## 2016-08-10 NOTE — Telephone Encounter (Signed)
  Refill sent for 1 month on simvastatin with message attached for OV needed for further refills.

## 2016-09-13 ENCOUNTER — Other Ambulatory Visit: Payer: Self-pay | Admitting: Family Medicine

## 2016-09-14 NOTE — Telephone Encounter (Signed)
[facility].  RF request for simvastatin LOV: 08/06/15 Next ov: None Last written: 08/10/16 #30 w/ 5HQ  Will send Rx for #30 w/ 0Rf. Pt is due for follow up on [facility]. Needs office visit for more refills.   Pt advised and voiced understanding.  Apt made for 09/21/16 at 8:30am.

## 2016-09-21 ENCOUNTER — Ambulatory Visit (INDEPENDENT_AMBULATORY_CARE_PROVIDER_SITE_OTHER): Payer: BLUE CROSS/BLUE SHIELD | Admitting: Family Medicine

## 2016-09-21 ENCOUNTER — Encounter: Payer: Self-pay | Admitting: Family Medicine

## 2016-09-21 VITALS — BP 124/78 | HR 74 | Temp 98.2°F | Resp 16 | Ht 72.0 in | Wt 206.8 lb

## 2016-09-21 DIAGNOSIS — I10 Essential (primary) hypertension: Secondary | ICD-10-CM

## 2016-09-21 DIAGNOSIS — E7800 Pure hypercholesterolemia, unspecified: Secondary | ICD-10-CM

## 2016-09-21 LAB — COMPREHENSIVE METABOLIC PANEL
ALK PHOS: 87 U/L (ref 39–117)
ALT: 16 U/L (ref 0–53)
AST: 15 U/L (ref 0–37)
Albumin: 4.7 g/dL (ref 3.5–5.2)
BILIRUBIN TOTAL: 0.8 mg/dL (ref 0.2–1.2)
BUN: 14 mg/dL (ref 6–23)
CALCIUM: 9.5 mg/dL (ref 8.4–10.5)
CO2: 28 meq/L (ref 19–32)
Chloride: 106 mEq/L (ref 96–112)
Creatinine, Ser: 1.13 mg/dL (ref 0.40–1.50)
GFR: 70.8 mL/min (ref >60.00)
GLUCOSE: 95 mg/dL (ref 70–99)
POTASSIUM: 4.2 meq/L (ref 3.5–5.1)
Sodium: 141 mEq/L (ref 135–145)
TOTAL PROTEIN: 7.2 g/dL (ref 6.0–8.3)

## 2016-09-21 LAB — LIPID PANEL
Cholesterol: 167 mg/dL (ref 0–200)
HDL: 40.1 mg/dL (ref >39.00)
LDL Cholesterol: 91 mg/dL (ref 0–99)
NONHDL: 127.19
TRIGLYCERIDES: 183 mg/dL — AB (ref 0.0–149.0)
Total CHOL/HDL Ratio: 4
VLDL: 36.6 mg/dL (ref 0.0–40.0)

## 2016-09-21 MED ORDER — SIMVASTATIN 80 MG PO TABS: 80.0000 mg | ORAL_TABLET | Freq: Every day | ORAL | 3 refills | Status: DC

## 2016-09-21 MED ORDER — LISINOPRIL 20 MG PO TABS: 20.0000 mg | ORAL_TABLET | Freq: Every day | ORAL | 3 refills | Status: DC

## 2016-09-21 NOTE — Progress Notes (Signed)
OFFICE VISIT  09/21/2016   CC:  Chief Complaint  Patient presents with  . Follow-up    [facility], pt is fasting.    HPI:    Patient is a 56 y.o.  male who presents for f/u HTN, Hypercholesterolemia. Last visit was for CPE 07/2015, all was well, all labs normal.  No home bp monitoring.  Compliant with meds.  Cholest: taking simva daily, no side effects.  Exercise= yardwork.  ROS: no CP, no SOB, no dizziness, no palpitations, no HAs, no LE swelling.  No myalgias/arthralgias.   Past Medical History:  Diagnosis Date  . ANEMIA DUE TO CHRONIC BLOOD LOSS 05/15/2009  . BPH with obstruction/lower urinary tract symptoms   . DYSLIPIDEMIA 08/25/2007  . HYPERTENSION 05/15/2009  . Palpitations 07/04/2009  . RECTAL BLEEDING 08/25/2007  . UNSPECIFIED URINARY CALCULUS 05/15/2009   Dr. Laverle Patter    Past Surgical History:  Procedure Laterality Date  . COLONOSCOPY  04/08/07   normal (Dr. Drue Flirt)    Outpatient Medications Prior to Visit  Medication Sig Dispense Refill  . lisinopril (PRINIVIL,ZESTRIL) 20 MG tablet Take 1 tablet (20 mg total) by mouth daily. 90 tablet 3  . simvastatin (ZOCOR) 80 MG tablet TAKE 1 TABLET (80 MG TOTAL) BY MOUTH DAILY. 30 tablet 0  . alfuzosin (UROXATRAL) 10 MG 24 hr tablet TAKE 1 TABLET (10 MG TOTAL) BY MOUTH DAILY. (Patient not taking: Reported on 09/21/2016) 90 tablet 2  . HYDROcodone-homatropine (HYCODAN) 5-1.5 MG/5ML syrup 1-2 tsp po tid prn cough and sore throat (Patient not taking: Reported on 09/21/2016) 120 mL 0   No facility-administered medications prior to visit.     Allergies  Allergen Reactions  . Diphenhydramine Hcl     REACTION: Swelling in hans, rash    ROS As per HPI  PE: Blood pressure 124/78, pulse 74, temperature 98.2 F (36.8 C), temperature source Oral, resp. rate 16, height 6' (1.829 m), weight 206 lb 12 oz (93.8 kg), SpO2 94 %. Gen: Alert, well appearing.  Patient is oriented to person, place, time, and situation. AFFECT:  pleasant, lucid thought and speech. ZOX:WRUE: no injection, icteris, swelling, or exudate.  EOMI, PERRLA. Mouth: lips without lesion/swelling.  Oral mucosa pink and moist. Oropharynx without erythema, exudate, or swelling.  CV: RRR, no m/r/g.   LUNGS: CTA bilat, nonlabored resps, good aeration in all lung fields. EXT: no clubbing, cyanosis, or edema.   LABS:  Lab Results  Component Value Date   TSH 1.32 08/06/2015   Lab Results  Component Value Date   WBC 8.4 08/06/2015   HGB 15.9 08/06/2015   HCT 47.2 08/06/2015   MCV 92.1 08/06/2015   PLT 248.0 08/06/2015   Lab Results  Component Value Date   CREATININE 1.06 08/06/2015   BUN 17 08/06/2015   NA 141 08/06/2015   K 4.2 08/06/2015   CL 107 08/06/2015   CO2 26 08/06/2015   Lab Results  Component Value Date   ALT 18 08/06/2015   AST 14 08/06/2015   ALKPHOS 84 08/06/2015   BILITOT 0.8 08/06/2015   Lab Results  Component Value Date   CHOL 145 08/06/2015   Lab Results  Component Value Date   HDL 36.30 (L) 08/06/2015   Lab Results  Component Value Date   LDLCALC 81 08/06/2015   Lab Results  Component Value Date   TRIG 142.0 08/06/2015   Lab Results  Component Value Date   CHOLHDL 4 08/06/2015   Lab Results  Component Value Date   PSA  0.81 08/06/2015   PSA 0.61 01/22/2014   PSA 0.57 11/07/2012    IMPRESSION AND PLAN:  1) HTN; The current medical regimen is effective;  continue present plan and medications. Lytes/cr today.  2) Hyperlipidemia: tolerating statin.  Check FLP and AST/ALT today.  An After Visit Summary was printed and given to the patient.  FOLLOW UP: Return in about 6 months (around 03/23/2017) for annual CPE (fasting).  Signed:  Santiago Bumpers, MD           09/21/2016

## 2016-09-21 NOTE — Progress Notes (Signed)
Pre visit review using our clinic review tool, if applicable. No additional management support is needed unless otherwise documented below in the visit note. 

## 2016-10-19 ENCOUNTER — Other Ambulatory Visit: Payer: Self-pay | Admitting: Family Medicine

## 2016-12-10 ENCOUNTER — Other Ambulatory Visit: Payer: Self-pay | Admitting: *Deleted

## 2016-12-10 MED ORDER — SIMVASTATIN 80 MG PO TABS: 80.0000 mg | ORAL_TABLET | Freq: Every day | ORAL | 3 refills | Status: DC

## 2016-12-10 NOTE — Telephone Encounter (Signed)
[facility].  RF request for simvastatin LOV: 09/21/16 Next ov: 03/26/17 Last written: 10/19/16 #30 w/ 3RF

## 2016-12-14 ENCOUNTER — Telehealth: Payer: Self-pay | Admitting: *Deleted

## 2016-12-14 MED ORDER — SIMVASTATIN 80 MG PO TABS: 80.0000 mg | ORAL_TABLET | Freq: Every day | ORAL | 3 refills | Status: DC

## 2016-12-14 NOTE — Telephone Encounter (Signed)
[facility]  RF request for lisinopril LOV: 09/21/16 Next ov:  03/26/17 Last written: 09/21/16 #90 w/ 3RF ([facility]

## 2016-12-16 MED ORDER — LISINOPRIL 20 MG PO TABS: 20.0000 mg | ORAL_TABLET | Freq: Every day | ORAL | 3 refills | Status: DC

## 2016-12-16 NOTE — Telephone Encounter (Signed)
[facility] stated that pt has requested they fill his lisinopril.   I sent Rx for simvastatin instead of lisinopril. I have eRxed Rx for lisinopril and will leave Rx for simvastatin as is unless other wise notified.

## 2016-12-16 NOTE — Addendum Note (Signed)
Addended by: Smitty KnudsenSUTHERLAND, Jeslynn Hollander K on: 12/16/2016 02:32 PM   Modules accepted: Orders

## 2016-12-17 ENCOUNTER — Telehealth: Payer: Self-pay | Admitting: Family Medicine

## 2016-12-17 NOTE — Telephone Encounter (Signed)
Patient states he just received a call from [facility] stating they have been unable to reach Dr. Samul DadaMcGowen's office regarding refill request.  He is asking some one call [facility] to see what additional information is needed.

## 2016-12-17 NOTE — Telephone Encounter (Signed)
See other phone note

## 2016-12-17 NOTE — Telephone Encounter (Signed)
Pt advised and voiced understanding.   

## 2016-12-17 NOTE — Telephone Encounter (Signed)
[phone]. This is in regards to Rx for Lisinopril & Symbostatin.

## 2016-12-17 NOTE — Telephone Encounter (Signed)
SW [facility], she stated that they did receive Rx's for simvastatin and lisinopril. She stated that the lisinopril has been shipped out but the simvastatin is on "hold fill" and will be shipped out on 12/28/16.   Left message for pt to call back. If pt needs the simvastatin sooner he will need to contact [facility] and request refill to be sent.

## 2017-03-26 ENCOUNTER — Ambulatory Visit (INDEPENDENT_AMBULATORY_CARE_PROVIDER_SITE_OTHER): Payer: BLUE CROSS/BLUE SHIELD | Admitting: Family Medicine

## 2017-03-26 ENCOUNTER — Encounter: Payer: Self-pay | Admitting: Family Medicine

## 2017-03-26 VITALS — BP 116/78 | HR 70 | Temp 98.4°F | Resp 16 | Ht 72.5 in | Wt 208.0 lb

## 2017-03-26 DIAGNOSIS — Z23 Encounter for immunization: Secondary | ICD-10-CM | POA: Diagnosis not present

## 2017-03-26 DIAGNOSIS — I10 Essential (primary) hypertension: Secondary | ICD-10-CM

## 2017-03-26 DIAGNOSIS — Z0000 Encounter for general adult medical examination without abnormal findings: Secondary | ICD-10-CM | POA: Diagnosis not present

## 2017-03-26 DIAGNOSIS — E7800 Pure hypercholesterolemia, unspecified: Secondary | ICD-10-CM

## 2017-03-26 DIAGNOSIS — M7542 Impingement syndrome of left shoulder: Secondary | ICD-10-CM

## 2017-03-26 DIAGNOSIS — Z125 Encounter for screening for malignant neoplasm of prostate: Secondary | ICD-10-CM | POA: Diagnosis not present

## 2017-03-26 LAB — CBC WITH DIFFERENTIAL/PLATELET
BASOS PCT: 0.8 % (ref 0.0–3.0)
Basophils Absolute: 0.1 10*3/uL (ref 0.0–0.1)
EOS ABS: 0.2 10*3/uL (ref 0.0–0.7)
EOS PCT: 2.4 % (ref 0.0–5.0)
HEMATOCRIT: 45.6 % (ref 39.0–52.0)
HEMOGLOBIN: 15.4 g/dL (ref 13.0–17.0)
LYMPHS PCT: 36.3 % (ref 12.0–46.0)
Lymphs Abs: 2.4 10*3/uL (ref 0.7–4.0)
MCHC: 33.9 g/dL (ref 30.0–36.0)
MCV: 94.1 fl (ref 78.0–100.0)
Monocytes Absolute: 0.6 10*3/uL (ref 0.1–1.0)
Monocytes Relative: 8.6 % (ref 3.0–12.0)
Neutro Abs: 3.4 10*3/uL (ref 1.4–7.7)
Neutrophils Relative %: 51.9 % (ref 43.0–77.0)
Platelets: 233 10*3/uL (ref 150.0–400.0)
RBC: 4.84 Mil/uL (ref 4.22–5.81)
RDW: 12.6 % (ref 11.5–15.5)
WBC: 6.6 10*3/uL (ref 4.0–10.5)

## 2017-03-26 LAB — COMPREHENSIVE METABOLIC PANEL
ALBUMIN: 4.7 g/dL (ref 3.5–5.2)
ALK PHOS: 81 U/L (ref 39–117)
ALT: 20 U/L (ref 0–53)
AST: 16 U/L (ref 0–37)
BUN: 15 mg/dL (ref 6–23)
CALCIUM: 9.4 mg/dL (ref 8.4–10.5)
CHLORIDE: 104 meq/L (ref 96–112)
CO2: 28 mEq/L (ref 19–32)
Creatinine, Ser: 1.09 mg/dL (ref 0.40–1.50)
GFR: 73.67 mL/min (ref >60.00)
Glucose, Bld: 92 mg/dL (ref 70–99)
POTASSIUM: 4.6 meq/L (ref 3.5–5.1)
SODIUM: 141 meq/L (ref 135–145)
TOTAL PROTEIN: 7.1 g/dL (ref 6.0–8.3)
Total Bilirubin: 0.6 mg/dL (ref 0.2–1.2)

## 2017-03-26 LAB — LIPID PANEL
Cholesterol: 148 mg/dL (ref 0–200)
HDL: 39.8 mg/dL (ref >39.00)
LDL CALC: 91 mg/dL (ref 0–99)
NonHDL: 108.33
Total CHOL/HDL Ratio: 4
Triglycerides: 85 mg/dL (ref 0.0–149.0)
VLDL: 17 mg/dL (ref 0.0–40.0)

## 2017-03-26 LAB — TSH: TSH: 1.56 u[IU]/mL (ref 0.35–4.50)

## 2017-03-26 LAB — PSA: PSA: 1.68 ng/mL (ref 0.10–4.00)

## 2017-03-26 NOTE — Progress Notes (Signed)
Office Note 03/26/2017  CC:  Chief Complaint  Patient presents with  . Annual Exam    Pt is fasting.    HPI:  Jared Jimenez is a 56 y.o.  male who is here for annual health maintenance exam.  Eyes: exam UTD. Dental: preventatives q6 mo. Exercise: yard work. Diet: now trying to eat 3 meals a day instead of skipping BF and lunch.  Pain in both shoulders x 4-5 mo, no preceding trauma or overuse or strain.  Not hurting at rest. Raising and reaching back make them hurt.  L>R.  Never had this before. No paresthesias or radiating of pain.  No arm weakness.   Past Medical History:  Diagnosis Date  . ANEMIA DUE TO CHRONIC BLOOD LOSS 05/15/2009  . BPH with obstruction/lower urinary tract symptoms   . DYSLIPIDEMIA 08/25/2007  . HYPERTENSION 05/15/2009  . Palpitations 07/04/2009  . RECTAL BLEEDING 08/25/2007  . UNSPECIFIED URINARY CALCULUS 05/15/2009   Dr. Laverle Patter    Past Surgical History:  Procedure Laterality Date  . COLONOSCOPY  04/08/07   normal (Dr. Drue Flirt)    Family History  Problem Relation Age of Onset  . Prostate cancer Brother   . Hyperlipidemia Brother   . Hypertension Brother   . Hyperlipidemia Mother   . Hypertension Mother   . Lung cancer Father        smoker  . Hypertension Father   . Clotting disorder Son   . Diabetes Neg Hx   . Hypertension Brother     Social History   Social History  . Marital status: Married    Spouse name: N/A  . Number of children: N/A  . Years of education: N/A   Occupational History  . Electrical engineer    Social History Main Topics  . Smoking status: Never Smoker  . Smokeless tobacco: Never Used  . Alcohol use Yes     Comment: 1-2/week  . Drug use: No  . Sexual activity: Not on file   Other Topics Concern  . Not on file   Social History Narrative   Married, 2 sons.   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   No T/A/Ds.       Outpatient Medications Prior to  Visit  Medication Sig Dispense Refill  . lisinopril (PRINIVIL,ZESTRIL) 20 MG tablet Take 1 tablet (20 mg total) by mouth daily. 90 tablet 3  . simvastatin (ZOCOR) 80 MG tablet Take 1 tablet (80 mg total) by mouth daily. 90 tablet 3   No facility-administered medications prior to visit.     Allergies  Allergen Reactions  . Diphenhydramine Hcl     REACTION: Swelling in hans, rash    ROS Review of Systems  Constitutional: Negative for appetite change, chills, fatigue and fever.  HENT: Negative for congestion, dental problem, ear pain and sore throat.   Eyes: Negative for discharge, redness and visual disturbance.  Respiratory: Negative for cough, chest tightness, shortness of breath and wheezing.   Cardiovascular: Negative for chest pain, palpitations and leg swelling.  Gastrointestinal: Negative for abdominal pain, blood in stool, diarrhea, nausea and vomiting.  Genitourinary: Negative for difficulty urinating, dysuria, flank pain, frequency, hematuria and urgency.  Musculoskeletal: Positive for arthralgias (shoulders--see HPI). Negative for back pain, joint swelling, myalgias and neck stiffness.  Skin: Negative for pallor and rash.  Neurological: Negative for dizziness, speech difficulty, weakness and headaches.  Hematological: Negative for adenopathy. Does not bruise/bleed easily.  Psychiatric/Behavioral: Negative for confusion and  sleep disturbance. The patient is not nervous/anxious.     PE; Blood pressure 116/78, pulse 70, temperature 98.4 F (36.9 C), temperature source Oral, resp. rate 16, height 6' 0.5" (1.842 m), weight 208 lb (94.3 kg), SpO2 96 %. Body mass index is 27.82 kg/m.  Gen: Alert, well appearing.  Patient is oriented to person, place, time, and situation. AFFECT: pleasant, lucid thought and speech. ENT: Ears: EACs clear, normal epithelium.  TMs with good light reflex and landmarks bilaterally.  Eyes: no injection, icteris, swelling, or exudate.  EOMI,  PERRLA. Nose: no drainage or turbinate edema/swelling.  No injection or focal lesion.  Mouth: lips without lesion/swelling.  Oral mucosa pink and moist.  Dentition intact and without obvious caries or gingival swelling.  Oropharynx without erythema, exudate, or swelling.  Neck: supple/nontender.  No LAD, mass, or TM.  Carotid pulses 2+ bilaterally, without bruits. CV: RRR, no m/r/g.   LUNGS: CTA bilat, nonlabored resps, good aeration in all lung fields. ABD: soft, NT, ND, BS normal.  No hepatospenomegaly or mass.  No bruits. EXT: no clubbing, cyanosis, or edema.  Musculoskeletal: no joint swelling, erythema, warmth, or tenderness.  ROM of all joints intact.   SHOULDERS: Right shoulder exam entirely normal.  L shoulder with no TTP anywhere.  He has limitation of ABduction to 90 by pain but can push through to full ROM.  Neg drop sign.  +Impingement signs.  Speeds and yergasons neg. Strength 5/5 prox/dist bilat.  ER/IR w/out pain bilat. Skin - no sores or suspicious lesions or rashes or color changes Rectal exam: negative without mass, lesions or tenderness, PROSTATE EXAM: smooth and symmetric without nodules or tenderness.   Pertinent labs:  Lab Results  Component Value Date   TSH 1.32 08/06/2015   Lab Results  Component Value Date   WBC 8.4 08/06/2015   HGB 15.9 08/06/2015   HCT 47.2 08/06/2015   MCV 92.1 08/06/2015   PLT 248.0 08/06/2015   Lab Results  Component Value Date   CREATININE 1.13 09/21/2016   BUN 14 09/21/2016   NA 141 09/21/2016   K 4.2 09/21/2016   CL 106 09/21/2016   CO2 28 09/21/2016   Lab Results  Component Value Date   ALT 16 09/21/2016   AST 15 09/21/2016   ALKPHOS 87 09/21/2016   BILITOT 0.8 09/21/2016   Lab Results  Component Value Date   CHOL 167 09/21/2016   Lab Results  Component Value Date   HDL 40.10 09/21/2016   Lab Results  Component Value Date   LDLCALC 91 09/21/2016   Lab Results  Component Value Date   TRIG 183.0 (H) 09/21/2016    Lab Results  Component Value Date   CHOLHDL 4 09/21/2016   Lab Results  Component Value Date   PSA 0.81 08/06/2015   PSA 0.61 01/22/2014   PSA 0.57 11/07/2012    ASSESSMENT AND PLAN:   1) Left shoulder impingement syndrome: PT referral today. Discussed tylenol/NSAIDs, relative rest.   2) Health maintenance exam: Reviewed age and gender appropriate health maintenance issues (prudent diet, regular exercise, health risks of tobacco and excessive alcohol, use of seatbelts, fire alarms in home, use of sunscreen).  Also reviewed age and gender appropriate health screening as well as vaccine recommendations. Vaccines: UTD.  Flu vaccine given today.  Shingrix--declines. Labs: Fasting HP + PSA. Prostate ca screening: DRE normal today , PSA. Colon ca screening: next colonoscopy due 03/2017.  Pt to set this up ASAP.  An After Visit Summary  was printed and given to the patient.  FOLLOW UP:  Return in about 6 months (around 09/24/2017) for routine chronic illness f/u.  Signed:  Santiago Bumpers, MD           03/26/2017

## 2017-03-26 NOTE — Patient Instructions (Signed)

## 2017-03-26 NOTE — Addendum Note (Signed)
Addended by: Smitty KnudsenSUTHERLAND, Kerri Asche K on: 03/26/2017 09:54 AM   Modules accepted: Orders

## 2017-04-12 ENCOUNTER — Encounter: Payer: Self-pay | Admitting: Gastroenterology

## 2017-06-09 ENCOUNTER — Telehealth: Payer: Self-pay | Admitting: Family Medicine

## 2017-06-09 NOTE — Telephone Encounter (Signed)
Copied from CRM [phone]. Topic: Inquiry >> Jun 09, 2017  3:34 PM Viviann SpareWhite, Selina wrote: Reason for CRM: Patient is requesting a referral to see the Urology Heloise PurpuraBorden, [place], Md. Patient can be reached at [phone] with any questions.

## 2017-06-09 NOTE — Telephone Encounter (Signed)
Patient has seen urologist before and was informed that he could call them to schedule an appointment. Patient will call and if has any problems will let us know.

## 2017-07-06 ENCOUNTER — Encounter: Payer: Self-pay | Admitting: Family Medicine

## 2017-07-14 ENCOUNTER — Encounter: Payer: Self-pay | Admitting: Family Medicine

## 2017-07-14 ENCOUNTER — Other Ambulatory Visit (HOSPITAL_COMMUNITY)
Admission: RE | Admit: 2017-07-14 | Discharge: 2017-07-14 | Disposition: A | Discharge status: Home / Self Care | Payer: BLUE CROSS/BLUE SHIELD | Source: Ambulatory Visit | Attending: Family Medicine | Admitting: Family Medicine

## 2017-07-14 ENCOUNTER — Ambulatory Visit (INDEPENDENT_AMBULATORY_CARE_PROVIDER_SITE_OTHER): Payer: BLUE CROSS/BLUE SHIELD | Admitting: Family Medicine

## 2017-07-14 VITALS — BP 130/84 | HR 84 | Temp 98.2°F | Wt 208.0 lb

## 2017-07-14 DIAGNOSIS — N5201 Erectile dysfunction due to arterial insufficiency: Secondary | ICD-10-CM | POA: Diagnosis not present

## 2017-07-14 DIAGNOSIS — Z113 Encounter for screening for infections with a predominantly sexual mode of transmission: Secondary | ICD-10-CM | POA: Insufficient documentation

## 2017-07-14 MED ORDER — SILDENAFIL CITRATE 20 MG PO TABS: ORAL_TABLET | ORAL | 3 refills | Status: DC

## 2017-07-14 NOTE — Progress Notes (Signed)
OFFICE VISIT  07/14/2017   CC:  Chief Complaint  Patient presents with  . Erectile Dysfunction    check for STD   HPI:    Patient is a 57 y.o.  male with BPH with LUTS who presents for erectile dysfunction.  C/o intermittent problems with getting erections --last 6 mo or so--he is newly single. Recent unprotected intercourse--desires STD screening.  No GU sx's other than the ED. He can get an erection but it is not as rigid, but ok for intercourse and doesn't last long enough to climax. Same sx's with masturbation.  Libido is intact. Started taking flomax in last couple weeks: helpful for his BPH sx's. He has never used a viagra or similar med.  He admits performance anxiety is potentially a problem.  Past Medical History:  Diagnosis Date  . ANEMIA DUE TO CHRONIC BLOOD LOSS 05/15/2009  . BPH with obstruction/lower urinary tract symptoms    Dr. Marlou PorchHerrick 06/28/17--Flomax trial.  . DYSLIPIDEMIA 08/25/2007  . Family history of prostate cancer    Brother  . HYPERTENSION 05/15/2009  . Kidney stones 05/15/2009   Dr. Laverle PatterBorden.  ESWL 2010.  Marland Kitchen. Palpitations 07/04/2009  . RECTAL BLEEDING 08/25/2007    Past Surgical History:  Procedure Laterality Date  . COLONOSCOPY  04/08/07   normal (Dr. Drue [place])  . CYSTOSCOPY W/ URETEROSCOPY W/ LITHOTRIPSY  2010    Outpatient Medications Prior to Visit  Medication Sig Dispense Refill  . lisinopril (PRINIVIL,ZESTRIL) 20 MG tablet Take 1 tablet (20 mg total) by mouth daily. 90 tablet 3  . simvastatin (ZOCOR) 80 MG tablet Take 1 tablet (80 mg total) by mouth daily. 90 tablet 3  . tamsulosin (FLOMAX) 0.4 MG CAPS capsule Take 0.4 mg by mouth daily.  11   No facility-administered medications prior to visit.     Allergies  Allergen Reactions  . Diphenhydramine Hcl     REACTION: Swelling in hans, rash    ROS As per HPI  PE: Blood pressure 130/84, pulse 84, temperature 98.2 F (36.8 C), temperature source Oral, weight 208 lb (94.3 kg), SpO2  95 %. Gen: Alert, well appearing.  Patient is oriented to person, place, time, and situation. AFFECT: pleasant, lucid thought and speech. No further exam today.  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 Trial of viagra 20mg , 1-5 tabs qd prn.  Therapeutic expectations and side effect profile of medication discussed today.  Patient's questions answered.  2) Unprotected intercourse: pt desires STD screening: ordered urine GC/Chlamydia, and RPR and HIV.  An After Visit Summary was printed and given to the patient.   FOLLOW UP: Return for as needed.  Signed:  Santiago BumpersPhil McGowen, MD           07/14/2017

## 2017-07-15 LAB — RPR: RPR Ser Ql: NONREACTIVE

## 2017-07-15 LAB — HIV ANTIBODY (ROUTINE TESTING W REFLEX): HIV 1&2 Ab, 4th Generation: NONREACTIVE

## 2017-07-15 LAB — URINE CYTOLOGY ANCILLARY ONLY
CHLAMYDIA, DNA PROBE: NEGATIVE
NEISSERIA GONORRHEA: NEGATIVE

## 2017-07-16 ENCOUNTER — Encounter: Payer: Self-pay | Admitting: *Deleted

## 2017-07-19 ENCOUNTER — Telehealth: Payer: Self-pay | Admitting: Family Medicine

## 2017-07-19 NOTE — Telephone Encounter (Signed)
Prior auth submitted

## 2017-07-19 NOTE — Telephone Encounter (Signed)
Copied from CRM [phone]. Topic: Quick Communication - Rx Refill/Question >> Jul 19, 2017 12:49 PM Crist InfanteHarrald, Kathy J wrote: Medication:  sildenafil (REVATIO) 20 MG tablet  [id number] [place], [place] - [address] [phone] (Phone) [phone] (Fax)  Needs prior authorization

## 2017-10-28 ENCOUNTER — Other Ambulatory Visit: Payer: Self-pay

## 2017-11-02 MED ORDER — SILDENAFIL CITRATE 20 MG PO TABS: ORAL_TABLET | ORAL | 3 refills | Status: DC

## 2017-11-08 ENCOUNTER — Telehealth: Payer: Self-pay | Admitting: Family Medicine

## 2017-11-08 NOTE — Telephone Encounter (Signed)
Also requesting 90 day supply of lisinopril (PRINIVIL,ZESTRIL) 20 MG tablet, simvastatin 80mg  and sildenafil 20mg  to [facility] fax [phone]

## 2017-11-08 NOTE — Telephone Encounter (Signed)
Copied from CRM [phone]. Topic: Quick Communication - Rx Refill/Question >> Nov 08, 2017  9:44 AM Oneal GroutSebastian, Jennifer S wrote: Medication: simvastatin (ZOCOR) 80 MG tablet requesting 30 day supply  Has the patient contacted their pharmacy? Yes.   (Agent: If no, request that the patient contact the pharmacy for the refill.) (Agent: If yes, when and what did the pharmacy advise?)  Preferred Pharm[facility] (with phone number [facility] [address]  Agent: Please be advised that RX refills may take up to 3 business days. We ask that you follow-up with your pharmacy.

## 2017-11-09 ENCOUNTER — Other Ambulatory Visit: Payer: Self-pay

## 2017-11-09 MED ORDER — LISINOPRIL 20 MG PO TABS: 20.0000 mg | ORAL_TABLET | Freq: Every day | ORAL | 0 refills | Status: DC

## 2017-11-09 MED ORDER — SILDENAFIL CITRATE 20 MG PO TABS: ORAL_TABLET | ORAL | 3 refills | Status: DC

## 2017-11-09 MED ORDER — SIMVASTATIN 80 MG PO TABS: 80.0000 mg | ORAL_TABLET | Freq: Every day | ORAL | 0 refills | Status: DC

## 2017-11-09 MED ORDER — SILDENAFIL CITRATE 20 MG PO TABS: ORAL_TABLET | ORAL | 0 refills | Status: DC

## 2017-11-09 NOTE — Telephone Encounter (Signed)
[facility] and asked to retrieve the [facility], verified the medication will be retrieved.

## 2017-11-09 NOTE — Telephone Encounter (Signed)
Patient called, left VM to return call and to verify which mail order pharmacy he wants to use, [facility] or [facility]. Advised Sildenafil was sent to [facility] on 11/02/17.

## 2017-11-09 NOTE — Telephone Encounter (Addendum)
Relation to pt: self  Call back number: [phone]   Reason for call:   Patient confirmed he would like lisinopril (PRINIVIL,ZESTRIL) 20 MG tablet , simvastatin (ZOCOR) 80 MG tablet, sildenafil (REVATIO) 20 MG tablet sent to  [facility] - [place], [place] - [id number] [place] due to insurance changing. Patient would like a few pills sent to   [id number] [place], [place] - [address] [phone] (Phone) [phone] (Fax)   Not [facility] [phone] - [place], [place] - [address], please advise

## 2017-11-09 NOTE — Addendum Note (Signed)
Addended by: Wilford CornerOVINGTON, Whitley Patchen W on: 11/09/2017 10:37 AM   Modules accepted: Orders

## 2017-11-15 ENCOUNTER — Telehealth: Payer: Self-pay | Admitting: Family Medicine

## 2017-11-15 NOTE — Telephone Encounter (Signed)
Patient notified that medication was filled at local pharmacy and to early to get from mail in pharmacy. Patient verbalized understanding.

## 2017-11-15 NOTE — Telephone Encounter (Signed)
Copied from CRM [id number]. Topic: Quick Communication - See Telephone Encounter >> Nov 15, 2017  2:41 PM Lorrine KinMcGee, Haset [place] B, NT wrote: CRM for notification. See Telephone encounter for: 11/15/17. Zack with [facility] calling and states that they need to have someone call them back in regards to the sildenafil (REVATIO) 20 MG tablet. States that the patient is saying he take 100mg  of this medication and that they have to verify with a pharmacist that this prescription is the same or equivalent.    CB#: [phone] Ref#: [id number]

## 2017-12-06 ENCOUNTER — Other Ambulatory Visit: Payer: Self-pay

## 2017-12-06 MED ORDER — SIMVASTATIN 80 MG PO TABS: 80.0000 mg | ORAL_TABLET | Freq: Every day | ORAL | 1 refills | Status: DC

## 2017-12-08 ENCOUNTER — Telehealth: Payer: Self-pay | Admitting: Family Medicine

## 2017-12-08 ENCOUNTER — Other Ambulatory Visit: Payer: Self-pay | Admitting: *Deleted

## 2017-12-08 MED ORDER — SIMVASTATIN 80 MG PO TABS: 80.0000 mg | ORAL_TABLET | Freq: Every day | ORAL | 1 refills | Status: DC

## 2017-12-08 NOTE — Telephone Encounter (Signed)
Copied from CRM [phone]. Topic: Quick Communication - See Telephone Encounter >> Dec 08, 2017  9:23 AM Jared Jimenez, Jared Jimenez, NT wrote: CRM Jared Jimenez notification. See Telephone encounter Jared Jimenez: 12/08/17. Jared Jimenez Jared Jimenez Jared Jimenez called and states that patient RX  simvastatin (ZOCOR) 80 MG tablet was never received. [facility]. Please sent to Jared Jimenez   Jared Jimenez/pharmacy #[id number]- [place], [place] - [address] [phone] (Phone) [phone] (Fax)

## 2017-12-08 NOTE — Telephone Encounter (Signed)
[facility]

## 2018-01-10 ENCOUNTER — Other Ambulatory Visit: Payer: Self-pay

## 2018-01-10 MED ORDER — LISINOPRIL 20 MG PO TABS: 20.0000 mg | ORAL_TABLET | Freq: Every day | ORAL | 0 refills | Status: DC

## 2018-02-03 ENCOUNTER — Other Ambulatory Visit: Payer: Self-pay | Admitting: Family Medicine

## 2018-04-03 ENCOUNTER — Other Ambulatory Visit: Payer: Self-pay | Admitting: Family Medicine

## 2018-06-13 ENCOUNTER — Encounter: Payer: Self-pay | Admitting: Family Medicine

## 2018-06-13 ENCOUNTER — Other Ambulatory Visit (HOSPITAL_COMMUNITY)
Admission: RE | Admit: 2018-06-13 | Discharge: 2018-06-13 | Disposition: A | Discharge status: Home / Self Care | Payer: 59 | Source: Ambulatory Visit | Attending: Family Medicine | Admitting: Family Medicine

## 2018-06-13 ENCOUNTER — Ambulatory Visit (INDEPENDENT_AMBULATORY_CARE_PROVIDER_SITE_OTHER): Payer: 59 | Admitting: Family Medicine

## 2018-06-13 VITALS — BP 121/74 | HR 65 | Temp 97.8°F | Resp 16 | Ht 72.5 in | Wt 206.1 lb

## 2018-06-13 DIAGNOSIS — Z23 Encounter for immunization: Secondary | ICD-10-CM

## 2018-06-13 DIAGNOSIS — E7800 Pure hypercholesterolemia, unspecified: Secondary | ICD-10-CM | POA: Diagnosis not present

## 2018-06-13 DIAGNOSIS — Z125 Encounter for screening for malignant neoplasm of prostate: Secondary | ICD-10-CM | POA: Diagnosis not present

## 2018-06-13 DIAGNOSIS — Z1159 Encounter for screening for other viral diseases: Secondary | ICD-10-CM

## 2018-06-13 DIAGNOSIS — E663 Overweight: Secondary | ICD-10-CM

## 2018-06-13 DIAGNOSIS — Z113 Encounter for screening for infections with a predominantly sexual mode of transmission: Secondary | ICD-10-CM | POA: Diagnosis not present

## 2018-06-13 DIAGNOSIS — I10 Essential (primary) hypertension: Secondary | ICD-10-CM | POA: Diagnosis not present

## 2018-06-13 DIAGNOSIS — Z0000 Encounter for general adult medical examination without abnormal findings: Secondary | ICD-10-CM

## 2018-06-13 NOTE — Progress Notes (Signed)
Office Note 06/13/2018  CC:  Chief Complaint  Patient presents with  . Annual Exam    Pt is not fasting.     HPI:  Jared Jimenez is a 58 y.o.  male with HTN, HLD, and BPH who is here for annual health maintenance exam.  Exercise: yard work. Diet: trying to eat more fruit and veggies. Dental and eye exams UTD.  He is single again and dating and asks for STD screening.  He denies being exposed to any STD and denies ever having an STD diagnosed in himself.    Past Medical History:  Diagnosis Date  . ANEMIA DUE TO CHRONIC BLOOD LOSS 05/15/2009  . BPH with obstruction/lower urinary tract symptoms    Dr. Marlou Porch 06/28/17--Flomax trial.  . DYSLIPIDEMIA 08/25/2007  . Family history of prostate cancer    Brother  . HYPERTENSION 05/15/2009  . Kidney stones 05/15/2009   Dr. Laverle Patter.  ESWL 2010.  Marland Kitchen Palpitations 07/04/2009  . RECTAL BLEEDING 08/25/2007    Past Surgical History:  Procedure Laterality Date  . COLONOSCOPY  04/08/07   normal (Dr. Drue Flirt)  . CYSTOSCOPY W/ URETEROSCOPY W/ LITHOTRIPSY  2010    Family History  Problem Relation Age of Onset  . Prostate cancer Brother   . Hyperlipidemia Brother   . Hypertension Brother   . Hyperlipidemia Mother   . Hypertension Mother   . Lung cancer Father        smoker  . Hypertension Father   . Clotting disorder Son   . Diabetes Neg Hx   . Hypertension Brother     Social History   Socioeconomic History  . Marital status: Legally Separated    Spouse name: Not on file  . Number of children: Not on file  . Years of education: Not on file  . Highest education level: Not on file  Occupational History  . Occupation: Electrical engineer  Social Needs  . Financial resource strain: Not on file  . Food insecurity:    Worry: Not on file    Inability: Not on file  . Transportation needs:    Medical: Not on file    Non-medical: Not on file  Tobacco Use  . Smoking status: Never Smoker  . Smokeless tobacco: Never Used   Substance and Sexual Activity  . Alcohol use: Yes    Comment: 1-2/week  . Drug use: No  . Sexual activity: Not on file  Lifestyle  . Physical activity:    Days per week: Not on file    Minutes per session: Not on file  . Stress: Not on file  Relationships  . Social connections:    Talks on phone: Not on file    Gets together: Not on file    Attends religious service: Not on file    Active member of club or organization: Not on file    Attends meetings of clubs or organizations: Not on file    Relationship status: Not on file  . Intimate partner violence:    Fear of current or ex partner: Not on file    Emotionally abused: Not on file    Physically abused: Not on file    Forced sexual activity: Not on file  Other Topics Concern  . Not on file  Social History Narrative   Married, 2 sons.   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   No T/A/Ds.    Outpatient Medications Prior to Visit  Medication Sig Dispense  Refill  . lisinopril (PRINIVIL,ZESTRIL) 20 MG tablet Take 1 tablet (20 mg total) by mouth daily. OFFICE VISIT NEEDED FOR MORE REFILLS 90 tablet 0  . sildenafil (REVATIO) 20 MG tablet 1-5 tabs po qd prn.  Take 30-60 min prior to intercourse 50 tablet 3  . simvastatin (ZOCOR) 80 MG tablet Take 1 tablet (80 mg total) by mouth daily. Dispense as written. 90 tablet 1  . tamsulosin (FLOMAX) 0.4 MG CAPS capsule Take 0.4 mg by mouth daily.  11  . lisinopril (PRINIVIL,ZESTRIL) 20 MG tablet Take 1 tablet (20 mg total) by mouth daily. (Patient not taking: Reported on 06/13/2018) 90 tablet 0   No facility-administered medications prior to visit.     Allergies  Allergen Reactions  . Diphenhydramine Hcl     REACTION: Swelling in hans, rash    ROS Review of Systems  Constitutional: Negative for appetite change, chills, fatigue and fever.  HENT: Negative for congestion, dental problem, ear pain and sore throat.   Eyes: Negative for discharge,  redness and visual disturbance.  Respiratory: Negative for cough, chest tightness, shortness of breath and wheezing.   Cardiovascular: Negative for chest pain, palpitations and leg swelling.  Gastrointestinal: Negative for abdominal pain, blood in stool, diarrhea, nausea and vomiting.  Genitourinary: Negative for difficulty urinating, dysuria, flank pain, frequency, hematuria and urgency.  Musculoskeletal: Negative for arthralgias, back pain, joint swelling, myalgias and neck stiffness.  Skin: Negative for pallor and rash.  Neurological: Negative for dizziness, speech difficulty, weakness and headaches.  Hematological: Negative for adenopathy. Does not bruise/bleed easily.  Psychiatric/Behavioral: Negative for confusion and sleep disturbance. The patient is not nervous/anxious.     PE; Blood pressure 121/74, pulse 65, temperature 97.8 F (36.6 C), temperature source Oral, resp. rate 16, height 6' 0.5" (1.842 m), weight 206 lb 2 oz (93.5 kg), SpO2 98 %. Body mass index is 27.57 kg/m.  Gen: Alert, well appearing.  Patient is oriented to person, place, time, and situation. AFFECT: pleasant, lucid thought and speech. ENT: Ears: EACs clear, normal epithelium.  TMs with good light reflex and landmarks bilaterally.  Eyes: no injection, icteris, swelling, or exudate.  EOMI, PERRLA. Nose: no drainage or turbinate edema/swelling.  No injection or focal lesion.  Mouth: lips without lesion/swelling.  Oral mucosa pink and moist.  Dentition intact and without obvious caries or gingival swelling.  Oropharynx without erythema, exudate, or swelling.  Neck: supple/nontender.  No LAD, mass, or TM.  Carotid pulses 2+ bilaterally, without bruits. CV: RRR, no m/r/g.   LUNGS: CTA bilat, nonlabored resps, good aeration in all lung fields. ABD: soft, NT, ND, BS normal.  No hepatospenomegaly or mass.  No bruits. EXT: no clubbing, cyanosis, or edema.  Musculoskeletal: no joint swelling, erythema, warmth, or  tenderness.  ROM of all joints intact. Skin - no sores or suspicious lesions or rashes or color changes Rectal exam: negative without mass, lesions or tenderness, PROSTATE EXAM: smooth and symmetric without nodules or tenderness.   Pertinent labs:  Lab Results  Component Value Date   TSH 1.56 03/26/2017   Lab Results  Component Value Date   WBC 6.6 03/26/2017   HGB 15.4 03/26/2017   HCT 45.6 03/26/2017   MCV 94.1 03/26/2017   PLT 233.0 03/26/2017   Lab Results  Component Value Date   CREATININE 1.09 03/26/2017   BUN 15 03/26/2017   NA 141 03/26/2017   K 4.6 03/26/2017   CL 104 03/26/2017   CO2 28 03/26/2017   Lab Results  Component Value Date   ALT 20 03/26/2017   AST 16 03/26/2017   ALKPHOS 81 03/26/2017   BILITOT 0.6 03/26/2017   Lab Results  Component Value Date   CHOL 148 03/26/2017   Lab Results  Component Value Date   HDL 39.80 03/26/2017   Lab Results  Component Value Date   LDLCALC 91 03/26/2017   Lab Results  Component Value Date   TRIG 85.0 03/26/2017   Lab Results  Component Value Date   CHOLHDL 4 03/26/2017   Lab Results  Component Value Date   PSA 1.68 03/26/2017   PSA 0.81 08/06/2015   PSA 0.61 01/22/2014   No results found for: HGBA1C   ASSESSMENT AND PLAN:   Health maintenance exam: Reviewed age and gender appropriate health maintenance issues (prudent diet, regular exercise, health risks of tobacco and excessive alcohol, use of seatbelts, fire alarms in home, use of sunscreen).  Also reviewed age and gender appropriate health screening as well as vaccine recommendations. Vaccines: Flu vaccine-->given today.   Pt declines shingrix. Labs: Fasting HP + PSA, Hep C ab, RPR, HIV -->ordered future so pt can get them with fasting labs.. Prostate ca screening: DRE normal today , PSA. Colon ca screening: overdue for 10 yr recall for colonoscopy-->Pt aware and will schedule this when he can.  An After Visit Summary was printed and given to  the patient.  FOLLOW UP:  No follow-ups on file.  Signed:  Santiago Bumpers, MD           06/13/2018

## 2018-06-13 NOTE — Patient Instructions (Signed)

## 2018-06-14 ENCOUNTER — Encounter: Payer: Self-pay | Admitting: Family Medicine

## 2018-06-14 ENCOUNTER — Other Ambulatory Visit (INDEPENDENT_AMBULATORY_CARE_PROVIDER_SITE_OTHER): Payer: 59

## 2018-06-14 DIAGNOSIS — Z113 Encounter for screening for infections with a predominantly sexual mode of transmission: Secondary | ICD-10-CM

## 2018-06-14 DIAGNOSIS — Z1159 Encounter for screening for other viral diseases: Secondary | ICD-10-CM

## 2018-06-14 DIAGNOSIS — E7800 Pure hypercholesterolemia, unspecified: Secondary | ICD-10-CM

## 2018-06-14 DIAGNOSIS — Z125 Encounter for screening for malignant neoplasm of prostate: Secondary | ICD-10-CM | POA: Diagnosis not present

## 2018-06-14 DIAGNOSIS — I10 Essential (primary) hypertension: Secondary | ICD-10-CM

## 2018-06-14 LAB — CBC WITH DIFFERENTIAL/PLATELET
BASOS ABS: 0.1 10*3/uL (ref 0.0–0.1)
Basophils Relative: 0.8 % (ref 0.0–3.0)
Eosinophils Absolute: 0.2 10*3/uL (ref 0.0–0.7)
Eosinophils Relative: 2.2 % (ref 0.0–5.0)
HCT: 45.4 % (ref 39.0–52.0)
Hemoglobin: 15.3 g/dL (ref 13.0–17.0)
Lymphocytes Relative: 31 % (ref 12.0–46.0)
Lymphs Abs: 2.2 10*3/uL (ref 0.7–4.0)
MCHC: 33.8 g/dL (ref 30.0–36.0)
MCV: 94.2 fl (ref 78.0–100.0)
MONOS PCT: 10 % (ref 3.0–12.0)
Monocytes Absolute: 0.7 10*3/uL (ref 0.1–1.0)
Neutro Abs: 3.9 10*3/uL (ref 1.4–7.7)
Neutrophils Relative %: 56 % (ref 43.0–77.0)
Platelets: 225 10*3/uL (ref 150.0–400.0)
RBC: 4.82 Mil/uL (ref 4.22–5.81)
RDW: 12.9 % (ref 11.5–15.5)
WBC: 7 10*3/uL (ref 4.0–10.5)

## 2018-06-14 LAB — COMPREHENSIVE METABOLIC PANEL
ALT: 13 U/L (ref 0–53)
AST: 13 U/L (ref 0–37)
Albumin: 4.5 g/dL (ref 3.5–5.2)
Alkaline Phosphatase: 83 U/L (ref 39–117)
BUN: 17 mg/dL (ref 6–23)
CO2: 28 mEq/L (ref 19–32)
Calcium: 9.2 mg/dL (ref 8.4–10.5)
Chloride: 105 mEq/L (ref 96–112)
Creatinine, Ser: 1.09 mg/dL (ref 0.40–1.50)
GFR: 73.36 mL/min (ref >60.00)
GLUCOSE: 91 mg/dL (ref 70–99)
Potassium: 4.3 mEq/L (ref 3.5–5.1)
Sodium: 139 mEq/L (ref 135–145)
Total Bilirubin: 0.8 mg/dL (ref 0.2–1.2)
Total Protein: 6.6 g/dL (ref 6.0–8.3)

## 2018-06-14 LAB — TSH: TSH: 2.4 u[IU]/mL (ref 0.35–4.50)

## 2018-06-14 LAB — URINE CYTOLOGY ANCILLARY ONLY
Chlamydia: NEGATIVE
Neisseria Gonorrhea: NEGATIVE

## 2018-06-14 LAB — PSA: PSA: 2.37 ng/mL (ref 0.10–4.00)

## 2018-06-14 LAB — LIPID PANEL
Cholesterol: 149 mg/dL (ref 0–200)
HDL: 41.5 mg/dL (ref >39.00)
LDL Cholesterol: 86 mg/dL (ref 0–99)
NONHDL: 107.19
Total CHOL/HDL Ratio: 4
Triglycerides: 104 mg/dL (ref 0.0–149.0)
VLDL: 20.8 mg/dL (ref 0.0–40.0)

## 2018-06-14 NOTE — Telephone Encounter (Signed)
Pt was seen for CPE yesterday. Was this discussed then? Does pt need to make another appt? Please advise. Thanks.

## 2018-06-14 NOTE — Telephone Encounter (Signed)
Needs o/v to discuss in detail before I can consider prescribing sleeping med.-thx

## 2018-06-15 ENCOUNTER — Encounter: Payer: Self-pay | Admitting: Family Medicine

## 2018-06-15 LAB — HIV ANTIBODY (ROUTINE TESTING W REFLEX): HIV 1&2 Ab, 4th Generation: NONREACTIVE

## 2018-06-15 LAB — HEPATITIS C ANTIBODY
Hepatitis C Ab: NONREACTIVE
SIGNAL TO CUT-OFF: 0.01 (ref <1.00)

## 2018-06-15 LAB — RPR: RPR Ser Ql: NONREACTIVE

## 2018-06-16 ENCOUNTER — Encounter: Payer: Self-pay | Admitting: *Deleted

## 2018-06-17 ENCOUNTER — Encounter: Payer: Self-pay | Admitting: Family Medicine

## 2018-06-17 NOTE — Telephone Encounter (Signed)
Pt would like to see Dr. Laverle Patter. Referral is pending, please review and sign. Thanks.

## 2018-06-22 ENCOUNTER — Other Ambulatory Visit: Payer: Self-pay | Admitting: Family Medicine

## 2018-07-07 DIAGNOSIS — R972 Elevated prostate specific antigen [PSA]: Secondary | ICD-10-CM | POA: Diagnosis not present

## 2018-07-19 ENCOUNTER — Other Ambulatory Visit: Payer: Self-pay | Admitting: Family Medicine

## 2018-10-20 ENCOUNTER — Other Ambulatory Visit: Payer: Self-pay

## 2018-10-20 ENCOUNTER — Ambulatory Visit: Payer: Self-pay | Admitting: *Deleted

## 2018-10-20 ENCOUNTER — Encounter: Payer: Self-pay | Admitting: Family Medicine

## 2018-10-20 ENCOUNTER — Ambulatory Visit (HOSPITAL_BASED_OUTPATIENT_CLINIC_OR_DEPARTMENT_OTHER)
Admission: RE | Admit: 2018-10-20 | Discharge: 2018-10-20 | Disposition: A | Discharge status: Home / Self Care | Payer: 59 | Source: Ambulatory Visit | Attending: Family Medicine | Admitting: Family Medicine

## 2018-10-20 ENCOUNTER — Ambulatory Visit (INDEPENDENT_AMBULATORY_CARE_PROVIDER_SITE_OTHER): Payer: 59 | Admitting: Family Medicine

## 2018-10-20 DIAGNOSIS — M79604 Pain in right leg: Secondary | ICD-10-CM | POA: Diagnosis present

## 2018-10-20 DIAGNOSIS — Z832 Family history of diseases of the blood and blood-forming organs and certain disorders involving the immune mechanism: Secondary | ICD-10-CM

## 2018-10-20 NOTE — Progress Notes (Signed)
Virtual Visit via Video Note  I connected with pt on 10/20/18 at  1:00 PM EDT by a video enabled telemedicine application and verified that I am speaking with the correct person using two identifiers.  Location patient: home Location provider:work or home office Persons participating in the virtual visit: patient, provider  I discussed the limitations of evaluation and management by telemedicine and the availability of in person appointments. The patient expressed understanding and agreed to proceed.  Telemedicine visit is a necessity given the COVID-19 restrictions in place at the current time.  HPI: 58 y/o male with HLD, HTN, and BPH who is being seen today for right leg complaints. From knee down it feels somewhat tight and "numb" for the last 4-5d, feels a bit like it has less sensation when he rubs it. Squatting down makes it feel tighter.  No swelling that he can discern. Mild sore throat last few days. He spread out a lot of mulch in yard lately, unclear if this STARTED his problem or not. Son was recently diagnosed with lupus anticoagulant per pt report today, has recurrent leg DVT. Pt has no recent history of surgical procedure or prolonged immobilization.  He has no hx of DVT himself. No dimpling when he presses into skin of leg.  No rash.  R leg w/out weakness, no limp, no ataxia.  Was bitten by a dog on R leg 4 mo ago, broke skin and bled some, pt cleaned it up and it healed fine, a little dark spot at the site is still present.  No SOB.  ROS: no CP, no SOB, no wheezing, no cough, no dizziness, no HAs, no rashes, no melena/hematochezia.  No polyuria or polydipsia.  No myalgias or arthralgias.   Past Medical History:  Diagnosis Date  . ANEMIA DUE TO CHRONIC BLOOD LOSS 05/15/2009  . BPH with obstruction/lower urinary tract symptoms    Dr. Marlou Porch 06/28/17--Flomax trial.  . DYSLIPIDEMIA 08/25/2007  . Family history of prostate cancer    Brother  . HYPERTENSION 05/15/2009  .  Increased prostate specific antigen (PSA) velocity    06/2018: PLAN-->pt will return to [facility] to see Dr. Laverle Patter.  . Kidney stones 05/15/2009   Dr. Laverle Patter.  ESWL 2010.  Marland Kitchen Palpitations 07/04/2009  . RECTAL BLEEDING 08/25/2007    Past Surgical History:  Procedure Laterality Date  . COLONOSCOPY  04/08/07   normal (Dr. Drue Flirt)  . CYSTOSCOPY W/ URETEROSCOPY W/ LITHOTRIPSY  2010    Family History  Problem Relation Age of Onset  . Prostate cancer Brother   . Hyperlipidemia Brother   . Hypertension Brother   . Hyperlipidemia Mother   . Hypertension Mother   . Lung cancer Father        smoker  . Hypertension Father   . Clotting disorder Son   . Diabetes Neg Hx   . Hypertension Brother     SOCIAL HX: married, Quarry manager.  No T/A/Ds.   Current Outpatient Medications:  .  lisinopril (PRINIVIL,ZESTRIL) 20 MG tablet, TAKE 1 TABLET BY MOUTH  DAILY, Disp: 90 tablet, Rfl: 1 .  sildenafil (REVATIO) 20 MG tablet, 1-5 tabs po qd prn.  Take 30-60 min prior to intercourse, Disp: 50 tablet, Rfl: 3 .  simvastatin (ZOCOR) 80 MG tablet, TAKE 1 TABLET BY MOUTH  DAILY., Disp: 90 tablet, Rfl: 1 .  tamsulosin (FLOMAX) 0.4 MG CAPS capsule, Take 0.4 mg by mouth daily., Disp: , Rfl: 11  EXAM:  VITALS per patient if applicable: There were  no vitals taken for this visit.   GENERAL: alert, oriented, appears well and in no acute distress  HEENT: atraumatic, conjunttiva clear, no obvious abnormalities on inspection of external nose and ears  NECK: normal movements of the head and neck  LUNGS: on inspection no signs of respiratory distress, breathing rate appears normal, no obvious gross SOB, gasping or wheezing  CV: no obvious cyanosis  MS: moves all visible extremities without noticeable abnormality  PSYCH/NEURO: pleasant and cooperative, no obvious depression or anxiety, speech and thought processing grossly intact  LABS: none today    Chemistry      Component Value  Date/Time   NA 139 06/14/2018 0802   K 4.3 06/14/2018 0802   CL 105 06/14/2018 0802   CO2 28 06/14/2018 0802   BUN 17 06/14/2018 0802   CREATININE 1.09 06/14/2018 0802      Component Value Date/Time   CALCIUM 9.2 06/14/2018 0802   CALCIUM 9.0 05/15/2009 1348   ALKPHOS 83 06/14/2018 0802   AST 13 06/14/2018 0802   ALT 13 06/14/2018 0802   BILITOT 0.8 06/14/2018 0802     Lab Results  Component Value Date   WBC 7.0 06/14/2018   HGB 15.3 06/14/2018   HCT 45.4 06/14/2018   MCV 94.2 06/14/2018   PLT 225.0 06/14/2018   ASSESSMENT AND PLAN:  Discussed the following assessment and plan:  Acute R lower leg tightness/discomfort and sensory changes.   Unclear etiology, possibly recent overuse while spreading mulch. However, since he has strong FH of recurrent DVT/son with Factor V leiden, will get stat RLE venous doppler u/s to eval for DVT. If neg, will get D dimer and if + will likely repeat venous u/s in a couple days to be sure.    I discussed the assessment and treatment plan with the patient. The patient was provided an opportunity to ask questions and all were answered. The patient agreed with the plan and demonstrated an understanding of the instructions.   The patient was advised to call back or seek an in-person evaluation if the symptoms worsen or if the condition fails to improve as anticipated.  F/u: to be determined based on w/u.  Signed:  Santiago BumpersPhil Evon Dejarnett, MD           10/20/2018

## 2018-10-20 NOTE — Telephone Encounter (Signed)
Pt has been scheduled for today @ 1pm.

## 2018-10-20 NOTE — Telephone Encounter (Signed)
Pt reports "Numbness" right leg, knee down. States "Like my leg is asleep." States mostly constant. Reports mild swelling "Little achy." Denies any redness, warmth, states "Skin feels tight." Also reports dog bite  t right ankle in [place]; states wound healed but ankle is discolored. Reports son was just diagnosed with lupus anticoagulant which started with similar symptoms, "But much worse." [place] called practice, [facility], for consideration of appt.  Pt's email and ph # verified.  Reason for Disposition . [1] MILD pain (e.g., does not interfere with normal activities) AND [2] present > 7 days  Answer Assessment - Initial Assessment Questions 1. ONSET: "When did the pain start?"      Numbness, 1week ago 2. LOCATION: "Where is the pain located?"      Knee down, right leg 3. PAIN: "How bad is the pain?"    (Scale 1-10; or mild, moderate, severe)   -  MILD (1-3): doesn't interfere with normal activities    -  MODERATE (4-7): interferes with normal activities (e.g., work or school) or awakens from sleep, limping    -  SEVERE (8-10): excruciating pain, unable to do any normal activities, unable to walk     Little achy 4. WORK OR EXERCISE: "Has there been any recent work or exercise that involved this part of the body?"      no 5. CAUSE: "What do you think is causing the leg pain?"     Unsure 6. OTHER SYMPTOMS: "Do you have any other symptoms?" (e.g., chest pain, back pain, breathing difficulty, swelling, rash, fever, numbness, weakness)     Mild swelling, dog bite in Jan at ankle, area is dark  Protocols used: LEG PAIN-A-AH

## 2018-12-05 ENCOUNTER — Other Ambulatory Visit: Payer: Self-pay | Admitting: Family Medicine

## 2018-12-09 ENCOUNTER — Other Ambulatory Visit: Payer: Self-pay | Admitting: Family Medicine

## 2019-02-28 ENCOUNTER — Other Ambulatory Visit: Payer: Self-pay | Admitting: Family Medicine

## 2019-03-01 ENCOUNTER — Other Ambulatory Visit: Payer: Self-pay

## 2019-03-17 ENCOUNTER — Other Ambulatory Visit: Payer: Self-pay | Admitting: Family Medicine

## 2019-03-17 NOTE — Telephone Encounter (Signed)
Patient is requesting a refill on medication. Last time it was filled was 11/09/17. Patient has not been seen since  10/20/2018 (acute), 06/13/2018 (CPE) and 07/14/17 for [facility].

## 2019-04-13 ENCOUNTER — Encounter: Payer: Self-pay | Admitting: Gastroenterology

## 2019-04-25 ENCOUNTER — Encounter: Payer: Self-pay | Admitting: Family Medicine

## 2019-04-25 ENCOUNTER — Ambulatory Visit (INDEPENDENT_AMBULATORY_CARE_PROVIDER_SITE_OTHER): Payer: 59 | Admitting: Family Medicine

## 2019-04-25 ENCOUNTER — Telehealth: Payer: Self-pay

## 2019-04-25 VITALS — Temp 97.0°F | Ht 72.5 in

## 2019-04-25 DIAGNOSIS — R0981 Nasal congestion: Secondary | ICD-10-CM | POA: Diagnosis not present

## 2019-04-25 DIAGNOSIS — J029 Acute pharyngitis, unspecified: Secondary | ICD-10-CM

## 2019-04-25 DIAGNOSIS — R05 Cough: Secondary | ICD-10-CM

## 2019-04-25 DIAGNOSIS — Z7189 Other specified counseling: Secondary | ICD-10-CM | POA: Diagnosis not present

## 2019-04-25 DIAGNOSIS — R059 Cough, unspecified: Secondary | ICD-10-CM

## 2019-04-25 MED ORDER — BENZONATATE 200 MG PO CAPS: 200.0000 mg | ORAL_CAPSULE | Freq: Two times a day (BID) | ORAL | 0 refills | Status: DC | PRN

## 2019-04-25 MED ORDER — LEVOCETIRIZINE DIHYDROCHLORIDE 5 MG PO TABS: 5.0000 mg | ORAL_TABLET | Freq: Every evening | ORAL | 1 refills | Status: DC

## 2019-04-25 NOTE — Progress Notes (Signed)
VIRTUAL VISIT VIA VIDEO  I connected with Jared Jimenez on 04/25/19 at  3:45 PM EST by a video enabled telemedicine application and verified that I am speaking with the correct person using two identifiers. Location patient: Home Location provider: [facility], Office Persons participating in the virtual visit: Patient, Dr. Raoul Pitch and R.Baker, LPN  I discussed the limitations of evaluation and management by telemedicine and the availability of in person appointments. The patient expressed understanding and agreed to proceed.   SUBJECTIVE Chief Complaint  Patient presents with  . Sore Throat    Has been feeling sick since Friday. Denies SOB or fever. 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 Ear Pain  . Nasal Congestion  . Cough   PCP: Dr. Anitra Lauth  HPI: Jared Jimenez is a 58 y.o. male present for acute illness.  Patient reports he has not been feeling well since Friday, November 13.  He reports cough, with clear production.  Nasal congestion and a few episodes of diarrhea over the weekend.  He denies fever, chills, nausea, vomit or shortness of breath.  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  He has no known sick contacts exposure he is aware of.  ROS: See pertinent positives and negatives per HPI.  Patient Active Problem List   Diagnosis Date Noted  . Health maintenance examination 08/06/2015  . 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 Routine general medical examination at a health care facility 11/07/2011  . HYPERTENSION 05/15/2009  . UNSPECIFIED URINARY CALCULUS 05/15/2009  . DYSLIPIDEMIA 08/25/2007  . ECZEMA 08/25/2007    Social History   Tobacco Use  . Smoking status: Never Smoker  . Smokeless tobacco: Never Used  Substance Use Topics  . Alcohol use: Yes    Comment: 1-2/week    Current Outpatient Medications:  .  levocetirizine (XYZAL) 5  MG tablet, Take by mouth., Disp: , Rfl:  .  lisinopril (ZESTRIL) 20 MG tablet, TAKE 1 TABLET BY MOUTH  DAILY, Disp: 90 tablet, Rfl: 3 .  sildenafil (REVATIO) 20 MG tablet, TAKE 1 TO 5 TABLETS BY  MOUTH EVERY DAY AS NEEDED  30 TO 60 MIN PRIOR TO  INTERCOURSE, Disp: 45 tablet, Rfl: 6 .  simvastatin (ZOCOR) 80 MG tablet, TAKE 1 TABLET BY MOUTH  DAILY, Disp: 90 tablet, Rfl: 1 .  tamsulosin (FLOMAX) 0.4 MG CAPS capsule, Take 0.4 mg by mouth daily., Disp: , Rfl: 11  Allergies  Allergen Reactions  . Diphenhydramine Hcl     REACTION: Swelling in hans, rash    OBJECTIVE: Temp (!) 97 F (36.1 C) (Oral)   Ht 6' 0.5" (1.842 m)   BMI 27.57 kg/m  Gen: No acute distress. Nontoxic in appearance.  HENT: AT. [place].  MMM.  Eyes:Pupils Equal Round Reactive to light, Extraocular movements intact,  Conjunctiva without redness, discharge or icterus. Chest: Cough or shortness of breath not present on exam. Skin: No rashes, purpura or petechiae.  Neuro:  Normal gait. Alert. Oriented x3  Psych: Normal affect, dress and demeanor. Normal speech. Normal thought content and judgment.  ASSESSMENT AND PLAN: Jared Jimenez is a 58 y.o. male present for  Cough/Sore throat/Nasal congestion/Educated about COVID-19 virus infection Rest, hydrate.  +/- flonase, mucinex (DM if cough) Tessalon Perles prescribed for him today. Restart Xyzal, prescribed for him today.  Possibly allergy related since he recently stopped his Xyzal.  However would not explain GI symptoms. - Novel Coronavirus, NAA ([facility]) -COVID-19 education and isolation discussed with him today.  He will present tomorrow at the [facility] for coronavirus testing.  If positive test he is aware to isolate 14 days from onset of symptoms, and must be symptom-free for 72 hours prior to ending isolation. -If shortness of breath occurs, he will need to be seen in the emergency room immediately.  Patient reports understanding.  > 25 minutes spent with  patient, >50% of time spent face to face     Felix Pacini, DO 04/25/2019

## 2019-04-25 NOTE — Telephone Encounter (Signed)
Pt called and stated he has had a sore throat and cough with phlegm since Friday.  Pt :thinks" it just may be cold symptoms, but is unsure.

## 2019-04-25 NOTE — Patient Instructions (Signed)

## 2019-04-25 NOTE — Telephone Encounter (Signed)
Called patient. Patient has been sick since Friday. Patient has sore throat, wet cough, sinus congestion. Declines fever and exposure to COVID. Patient would like to have a VV at 3:45pm today

## 2019-04-26 ENCOUNTER — Other Ambulatory Visit: Payer: Self-pay

## 2019-04-26 DIAGNOSIS — Z20822 Contact with and (suspected) exposure to covid-19: Secondary | ICD-10-CM

## 2019-04-26 NOTE — Telephone Encounter (Signed)
Patient was seen yesterday via virtual visit.

## 2019-04-28 LAB — NOVEL CORONAVIRUS, NAA: SARS-CoV-2, NAA: NOT DETECTED

## 2019-05-09 DIAGNOSIS — K5730 Diverticulosis of large intestine without perforation or abscess without bleeding: Secondary | ICD-10-CM

## 2019-05-09 HISTORY — DX: Diverticulosis of large intestine without perforation or abscess without bleeding: K57.30

## 2019-05-15 ENCOUNTER — Other Ambulatory Visit: Payer: Self-pay

## 2019-05-15 ENCOUNTER — Ambulatory Visit (AMBULATORY_SURGERY_CENTER): Payer: 59

## 2019-05-15 VITALS — Temp 96.2°F | Ht 72.5 in | Wt 208.4 lb

## 2019-05-15 DIAGNOSIS — Z1211 Encounter for screening for malignant neoplasm of colon: Secondary | ICD-10-CM

## 2019-05-15 DIAGNOSIS — Z1159 Encounter for screening for other viral diseases: Secondary | ICD-10-CM

## 2019-05-15 MED ORDER — NA SULFATE-K SULFATE-MG SULF 17.5-3.13-1.6 GM/177ML PO SOLN: 1.0000 | Freq: Once | ORAL | 0 refills | Status: AC

## 2019-05-15 NOTE — Progress Notes (Signed)

## 2019-05-16 ENCOUNTER — Encounter: Payer: Self-pay | Admitting: Gastroenterology

## 2019-05-24 ENCOUNTER — Other Ambulatory Visit: Payer: Self-pay | Admitting: Gastroenterology

## 2019-05-24 ENCOUNTER — Ambulatory Visit (INDEPENDENT_AMBULATORY_CARE_PROVIDER_SITE_OTHER): Payer: 59

## 2019-05-24 DIAGNOSIS — Z1159 Encounter for screening for other viral diseases: Secondary | ICD-10-CM

## 2019-05-25 ENCOUNTER — Other Ambulatory Visit: Payer: Self-pay | Admitting: Family Medicine

## 2019-05-25 LAB — SARS CORONAVIRUS 2 (TAT 6-24 HRS): SARS Coronavirus 2: NEGATIVE

## 2019-05-29 ENCOUNTER — Ambulatory Visit (AMBULATORY_SURGERY_CENTER): Payer: 59 | Admitting: Gastroenterology

## 2019-05-29 ENCOUNTER — Encounter: Payer: Self-pay | Admitting: Gastroenterology

## 2019-05-29 ENCOUNTER — Other Ambulatory Visit: Payer: Self-pay

## 2019-05-29 VITALS — BP 103/68 | HR 70 | Temp 98.4°F | Resp 14 | Ht 72.5 in | Wt 208.0 lb

## 2019-05-29 DIAGNOSIS — Z1211 Encounter for screening for malignant neoplasm of colon: Secondary | ICD-10-CM | POA: Diagnosis not present

## 2019-05-29 MED ORDER — SODIUM CHLORIDE 0.9 % IV SOLN: 500.0000 mL | Freq: Once | INTRAVENOUS | Status: DC

## 2019-05-29 NOTE — Progress Notes (Signed)
Temp by JB VS by DT   Pt's states no medical or surgical changes since previsit or office visit.  

## 2019-05-29 NOTE — Patient Instructions (Signed)

## 2019-05-29 NOTE — Op Note (Signed)
[facility] Patient Name: Jared Jimenez Procedure Date: 05/29/2019 9:15 AM MRN: [id number] Endoscopist: Rachael Fee , MD Age: 58 Referring MD:  Date of Birth: 1960/07/22 Gender: Male Account #: [id number] Procedure:                Colonoscopy Indications:              Screening for colorectal malignant neoplasm Medicines:                Monitored Anesthesia Care Procedure:                Pre-Anesthesia Assessment:                           - Prior to the procedure, a History and Physical                            was performed, and patient medications and                            allergies were reviewed. The patient's tolerance of                            previous anesthesia was also reviewed. The risks                            and benefits of the procedure and the sedation                            options and risks were discussed with the patient.                            All questions were answered, and informed consent                            was obtained. Prior Anticoagulants: The patient has                            taken no previous anticoagulant or antiplatelet                            agents. ASA Grade Assessment: II - A patient with                            mild systemic disease. After reviewing the risks                            and benefits, the patient was deemed in                            satisfactory condition to undergo the procedure.                           After obtaining informed consent, the colonoscope  was passed under direct vision. Throughout the                            procedure, the patient's blood pressure, pulse, and                            oxygen saturations were monitored continuously. The                            Colonoscope was introduced through the anus and                            advanced to the the cecum, identified by                            appendiceal orifice and  ileocecal valve. The                            colonoscopy was performed without difficulty. The                            patient tolerated the procedure well. The quality                            of the bowel preparation was good. The ileocecal                            valve, appendiceal orifice, and rectum were                            photographed. Scope In: 9:20:19 AM Scope Out: 9:33:34 AM Scope Withdrawal Time: 0 hours 10 minutes 15 seconds  Total Procedure Duration: 0 hours 13 minutes 15 seconds  Findings:                 Multiple small and large-mouthed diverticula were                            found in the entire colon.                           External hemorrhoids were found. The hemorrhoids                            were medium-sized.                           The exam was otherwise without abnormality on                            direct and retroflexion views. Complications:            No immediate complications. Estimated blood loss:                            None. Estimated Blood Loss:  Estimated blood loss: none. Impression:               - Diverticulosis in the entire examined colon.                           - External hemorrhoids.                           - The examination was otherwise normal on direct                            and retroflexion views.                           - No polyps or cancers. Recommendation:           - Patient has a contact number available for                            emergencies. The signs and symptoms of potential                            delayed complications were discussed with the                            patient. Return to normal activities tomorrow.                            Written discharge instructions were provided to the                            patient.                           - Resume previous diet.                           - Continue present medications.                           - Repeat  colonoscopy in 10 years for screening. Rachael Feeaniel P Billy Rocco, MD 05/29/2019 9:36:37 AM This report has been signed electronically.

## 2019-05-29 NOTE — Progress Notes (Signed)
Report given to PACU, vss 

## 2019-05-31 ENCOUNTER — Telehealth: Payer: Self-pay | Admitting: *Deleted

## 2019-05-31 NOTE — Telephone Encounter (Signed)
  Follow up Call-  Call back number 05/29/2019  Post procedure Call Back phone  # [phone]  Permission to leave phone message Yes  Some recent data might be hidden    [facility]

## 2019-05-31 NOTE — Telephone Encounter (Signed)
  Follow up Call-  Call back number 05/29/2019  Post procedure Call Back phone  # [phone]  Permission to leave phone message Yes  Some recent data might be hidden     Patient questions:  Do you have a fever, pain , or abdominal swelling? No. Pain Score  0 *  Have you tolerated food without any problems? Yes.    Have you been able to return to your normal activities? Yes.    Do you have any questions about your discharge instructions: Diet   No. Medications  No. Follow up visit  No.  Do you have questions or concerns about your Care? No.  Actions: * If pain score is 4 or above: No action needed, pain <4.  1. Have you developed a fever since your procedure? no  2.   Have you had an respiratory symptoms (SOB or cough) since your procedure? no  3.   Have you tested positive for COVID 19 since your procedure no  4.   Have you had any family members/close contacts diagnosed with the COVID 19 since your procedure? no   If yes to any of these questions please route to Joylene John, RN and Alphonsa Gin, Therapist, sports.

## 2019-06-11 ENCOUNTER — Encounter: Payer: Self-pay | Admitting: Family Medicine

## 2019-06-16 ENCOUNTER — Other Ambulatory Visit: Payer: Self-pay

## 2019-06-19 ENCOUNTER — Encounter: Payer: Self-pay | Admitting: Family Medicine

## 2019-06-19 ENCOUNTER — Other Ambulatory Visit (HOSPITAL_COMMUNITY)
Admission: RE | Admit: 2019-06-19 | Discharge: 2019-06-19 | Disposition: A | Discharge status: Home / Self Care | Payer: 59 | Source: Ambulatory Visit | Attending: Family Medicine | Admitting: Family Medicine

## 2019-06-19 ENCOUNTER — Ambulatory Visit (INDEPENDENT_AMBULATORY_CARE_PROVIDER_SITE_OTHER): Payer: 59 | Admitting: Family Medicine

## 2019-06-19 ENCOUNTER — Other Ambulatory Visit: Payer: Self-pay

## 2019-06-19 VITALS — BP 126/85 | HR 85 | Temp 98.9°F | Resp 16 | Ht 72.5 in | Wt 211.8 lb

## 2019-06-19 DIAGNOSIS — I10 Essential (primary) hypertension: Secondary | ICD-10-CM

## 2019-06-19 DIAGNOSIS — R972 Elevated prostate specific antigen [PSA]: Secondary | ICD-10-CM

## 2019-06-19 DIAGNOSIS — Z7251 High risk heterosexual behavior: Secondary | ICD-10-CM

## 2019-06-19 DIAGNOSIS — E785 Hyperlipidemia, unspecified: Secondary | ICD-10-CM | POA: Diagnosis not present

## 2019-06-19 DIAGNOSIS — Z0000 Encounter for general adult medical examination without abnormal findings: Secondary | ICD-10-CM | POA: Diagnosis not present

## 2019-06-19 LAB — COMPREHENSIVE METABOLIC PANEL
ALT: 26 U/L (ref 0–53)
AST: 19 U/L (ref 0–37)
Albumin: 4.7 g/dL (ref 3.5–5.2)
Alkaline Phosphatase: 94 U/L (ref 39–117)
BUN: 14 mg/dL (ref 6–23)
CO2: 27 mEq/L (ref 19–32)
Calcium: 9.3 mg/dL (ref 8.4–10.5)
Chloride: 105 mEq/L (ref 96–112)
Creatinine, Ser: 1.12 mg/dL (ref 0.40–1.50)
GFR: 66.67 mL/min (ref >60.00)
Glucose, Bld: 90 mg/dL (ref 70–99)
Potassium: 4.3 mEq/L (ref 3.5–5.1)
Sodium: 140 mEq/L (ref 135–145)
Total Bilirubin: 0.7 mg/dL (ref 0.2–1.2)
Total Protein: 7.3 g/dL (ref 6.0–8.3)

## 2019-06-19 LAB — CBC WITH DIFFERENTIAL/PLATELET
Basophils Absolute: 0 10*3/uL (ref 0.0–0.1)
Basophils Relative: 0.4 % (ref 0.0–3.0)
Eosinophils Absolute: 0.3 10*3/uL (ref 0.0–0.7)
Eosinophils Relative: 3.8 % (ref 0.0–5.0)
HCT: 45.7 % (ref 39.0–52.0)
Hemoglobin: 15.4 g/dL (ref 13.0–17.0)
Lymphocytes Relative: 30.4 % (ref 12.0–46.0)
Lymphs Abs: 2.2 10*3/uL (ref 0.7–4.0)
MCHC: 33.7 g/dL (ref 30.0–36.0)
MCV: 94.3 fl (ref 78.0–100.0)
Monocytes Absolute: 0.6 10*3/uL (ref 0.1–1.0)
Monocytes Relative: 8.7 % (ref 3.0–12.0)
Neutro Abs: 4.1 10*3/uL (ref 1.4–7.7)
Neutrophils Relative %: 56.7 % (ref 43.0–77.0)
Platelets: 245 10*3/uL (ref 150.0–400.0)
RBC: 4.84 Mil/uL (ref 4.22–5.81)
RDW: 13.1 % (ref 11.5–15.5)
WBC: 7.3 10*3/uL (ref 4.0–10.5)

## 2019-06-19 LAB — LIPID PANEL
Cholesterol: 177 mg/dL (ref 0–200)
HDL: 44.5 mg/dL (ref >39.00)
LDL Cholesterol: 106 mg/dL — ABNORMAL HIGH (ref 0–99)
NonHDL: 132.85
Total CHOL/HDL Ratio: 4
Triglycerides: 133 mg/dL (ref 0.0–149.0)
VLDL: 26.6 mg/dL (ref 0.0–40.0)

## 2019-06-19 LAB — TSH: TSH: 2.03 u[IU]/mL (ref 0.35–4.50)

## 2019-06-19 LAB — PSA
PSA: 2.33
PSA: 2.33 ng/mL (ref 0.10–4.00)

## 2019-06-19 NOTE — Patient Instructions (Signed)
covid vaccines: Pay attention to the news the next few weeks- COVID vaccines are starting for the community.  Appointments are required and can be made beginning at 8 a.m. Once your age bracket is called (watch news and visit website below for info) appt can be made by calling [phone] and selecting option 2. Walk-ins will not be accepted.                  Clinic locations are: . [facility], [address] [place]; . [facility], [address], ; . [facility], [address], [address], [place].  Participants are asked to wear a face covering at vaccination sites.  Visit [email or web address] and click on the "COVID-19 Vaccine Info" rectangle for more information about vaccinations.  

## 2019-06-19 NOTE — Progress Notes (Signed)
Office Note 06/19/2019  CC:  Chief Complaint  Patient presents with  . Annual Exam    pt is fasting    HPI:  Jared Jimenez is a 59 y.o. male who is here for annual health maintenance exam.  "Phlegmy cough only when lies down at night" 6 wks now, since he had a viral syndrome. No runny nose/cong/st/sob/fever/body aches.  Covid test was neg.  He returned to see Dr. Laverle Patter 12/2018 regarding his PSA and it went way down on his check. Has f/u with Dr. Laverle Patter 07/2018 but if today's check is down then that appt will be cancelled.  Pt has been divorced, is sexually active again, no known STD exposure but requests STD screening today.  Past Medical History:  Diagnosis Date  . Allergy   . ANEMIA DUE TO CHRONIC BLOOD LOSS 05/15/2009  . BPH with obstruction/lower urinary tract symptoms    Dr. Marlou Porch 06/28/17--Flomax trial.  . Diverticulosis of colon 05/2019  . DYSLIPIDEMIA 08/25/2007  . Family history of prostate cancer    Brother  . HYPERTENSION 05/15/2009  . Increased prostate specific antigen (PSA) velocity    06/2018: PLAN-->pt will return to [facility] to see Dr. Laverle Patter.  . Kidney stones 05/15/2009   Dr. Laverle Patter.  ESWL 2010.  Marland Kitchen Palpitations 07/04/2009  . RECTAL BLEEDING 08/25/2007    Past Surgical History:  Procedure Laterality Date  . COLONOSCOPY  04/08/07; 05/29/19   normal 2008 and 2020 (Dr. Drue Flirt): recall 2030.  Marland Kitchen CYSTOSCOPY W/ URETEROSCOPY W/ LITHOTRIPSY  2010    Family History  Problem Relation Age of Onset  . Prostate cancer Brother   . Hyperlipidemia Brother   . Hypertension Brother   . Hyperlipidemia Mother   . Hypertension Mother   . Lung cancer Father        smoker  . Hypertension Father   . Clotting disorder Son   . Hypertension Brother   . Diabetes Neg Hx   . Colon cancer Neg Hx   . Colon polyps Neg Hx   . Esophageal cancer Neg Hx   . Stomach cancer Neg Hx   . Rectal cancer Neg Hx     Social History   Socioeconomic History  .  Marital status: Legally Separated    Spouse name: Not on file  . Number of children: Not on file  . Years of education: Not on file  . Highest education level: Not on file  Occupational History  . Occupation: Electrical engineer  Tobacco Use  . Smoking status: Never Smoker  . Smokeless tobacco: Never Used  Substance and Sexual Activity  . Alcohol use: Yes    Alcohol/week: 2.0 standard drinks    Types: 1 Glasses of wine, 1 Cans of beer per week    Comment: 1-2/week  . Drug use: No  . Sexual activity: Not on file  Other Topics Concern  . Not on file  Social History Narrative   Married, 2 sons.   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   No T/A/Ds.   Social Determinants of Health   Financial Resource Strain:   . Difficulty of Paying Living Expenses: Not on file  Food Insecurity:   . Worried About Programme researcher, broadcasting/film/video in the Last Year: Not on file  . Ran Out of Food in the Last Year: Not on file  Transportation Needs:   . Lack of Transportation (Medical): Not on file  . Lack of Transportation (Non-Medical): Not on  file  Physical Activity:   . Days of Exercise per Week: Not on file  . Minutes of Exercise per Session: Not on file  Stress:   . Feeling of Stress : Not on file  Social Connections:   . Frequency of Communication with Friends and Family: Not on file  . Frequency of Social Gatherings with Friends and Family: Not on file  . Attends Religious Services: Not on file  . Active Member of Clubs or Organizations: Not on file  . Attends Banker Meetings: Not on file  . Marital Status: Not on file  Intimate Partner Violence:   . Fear of Current or Ex-Partner: Not on file  . Emotionally Abused: Not on file  . Physically Abused: Not on file  . Sexually Abused: Not on file    Outpatient Medications Prior to Visit  Medication Sig Dispense Refill  . levocetirizine (XYZAL) 5 MG tablet Take 1 tablet (5 mg total) by mouth  every evening. 90 tablet 1  . lisinopril (ZESTRIL) 20 MG tablet TAKE 1 TABLET BY MOUTH  DAILY 90 tablet 3  . sildenafil (REVATIO) 20 MG tablet TAKE 1 TO 5 TABLETS BY  MOUTH EVERY DAY AS NEEDED  30 TO 60 MIN PRIOR TO  INTERCOURSE 45 tablet 6  . simvastatin (ZOCOR) 80 MG tablet TAKE 1 TABLET BY MOUTH  DAILY 90 tablet 0  . tamsulosin (FLOMAX) 0.4 MG CAPS capsule Take 0.4 mg by mouth daily.  11   No facility-administered medications prior to visit.    Allergies  Allergen Reactions  . Diphenhydramine Hcl     REACTION: Swelling in hans, rash    ROS Review of Systems  Constitutional: Negative for appetite change, chills, fatigue and fever.  HENT: Negative for congestion, dental problem, ear pain and sore throat.   Eyes: Negative for discharge, redness and visual disturbance.  Respiratory: Negative for cough, chest tightness, shortness of breath and wheezing.   Cardiovascular: Negative for chest pain, palpitations and leg swelling.  Gastrointestinal: Negative for abdominal pain, blood in stool, diarrhea, nausea and vomiting.  Genitourinary: Negative for difficulty urinating, dysuria, flank pain, frequency, hematuria and urgency.  Musculoskeletal: Negative for arthralgias, back pain, joint swelling, myalgias and neck stiffness.  Skin: Negative for pallor and rash.  Neurological: Negative for dizziness, speech difficulty, weakness and headaches.  Hematological: Negative for adenopathy. Does not bruise/bleed easily.  Psychiatric/Behavioral: Negative for confusion and sleep disturbance. The patient is not nervous/anxious.     PE; Blood pressure 126/85, pulse 85, temperature 98.9 F (37.2 C), temperature source Temporal, resp. rate 16, height 6' 0.5" (1.842 m), weight 211 lb 12.8 oz (96.1 kg), SpO2 95 %. Body mass index is 28.33 kg/m.  Gen: Alert, well appearing.  Patient is oriented to person, place, time, and situation. AFFECT: pleasant, lucid thought and speech. ENT: Ears: EACs clear,  normal epithelium.  TMs with good light reflex and landmarks bilaterally.  Eyes: no injection, icteris, swelling, or exudate.  EOMI, PERRLA. Nose: no drainage or turbinate edema/swelling.  No injection or focal lesion.  Mouth: lips without lesion/swelling.  Oral mucosa pink and moist.  Dentition intact and without obvious caries or gingival swelling.  Oropharynx without erythema, exudate, or swelling.  Neck: supple/nontender.  No LAD, mass, or TM.  Carotid pulses 2+ bilaterally, without bruits. CV: RRR, no m/r/g.   LUNGS: CTA bilat, nonlabored resps, good aeration in all lung fields. ABD: soft, NT, ND, BS normal.  No hepatospenomegaly or mass.  No bruits. EXT: no  clubbing, cyanosis, or edema.  Musculoskeletal: no joint swelling, erythema, warmth, or tenderness.  ROM of all joints intact. Skin - no sores or suspicious lesions or rashes or color changes Rectal: pt declined  Pertinent labs:  Lab Results  Component Value Date   TSH 2.40 06/14/2018   Lab Results  Component Value Date   WBC 7.0 06/14/2018   HGB 15.3 06/14/2018   HCT 45.4 06/14/2018   MCV 94.2 06/14/2018   PLT 225.0 06/14/2018   Lab Results  Component Value Date   CREATININE 1.09 06/14/2018   BUN 17 06/14/2018   NA 139 06/14/2018   K 4.3 06/14/2018   CL 105 06/14/2018   CO2 28 06/14/2018   Lab Results  Component Value Date   ALT 13 06/14/2018   AST 13 06/14/2018   ALKPHOS 83 06/14/2018   BILITOT 0.8 06/14/2018   Lab Results  Component Value Date   CHOL 149 06/14/2018   Lab Results  Component Value Date   HDL 41.50 06/14/2018   Lab Results  Component Value Date   LDLCALC 86 06/14/2018   Lab Results  Component Value Date   TRIG 104.0 06/14/2018   Lab Results  Component Value Date   CHOLHDL 4 06/14/2018   Lab Results  Component Value Date   PSA 2.37 06/14/2018   PSA 1.68 03/26/2017   PSA 0.81 08/06/2015   No results found for: HGBA1C   ASSESSMENT AND PLAN:   Health maintenance  exam: Reviewed age and gender appropriate health maintenance issues (prudent diet, regular exercise, health risks of tobacco and excessive alcohol, use of seatbelts, fire alarms in home, use of sunscreen).  Also reviewed age and gender appropriate health screening as well as vaccine recommendations. Vaccines: flu UTD.  Pt declines shingrix. Labs:  Fasting HP, PSA, RPR, HIV, GC/Chl screening today as well as per pt request. Prostate ca screening: Declined DRE today, checking PSA again due to hx of elev PSA/inc PST velocity. Colon ca screening: next colonoscopy due 2030.  High risk sexual behavior: unprotected sex again with new partner (s) since getting divorced. Pt requests STD screening today (see labs above).   An After Visit Summary was printed and given to the patient.  FOLLOW UP:  No follow-ups on file.  Signed:  Crissie Sickles, MD           06/19/2019

## 2019-06-20 ENCOUNTER — Other Ambulatory Visit: Payer: Self-pay

## 2019-06-20 ENCOUNTER — Encounter: Payer: Self-pay | Admitting: Family Medicine

## 2019-06-20 LAB — HIV ANTIBODY (ROUTINE TESTING W REFLEX): HIV 1&2 Ab, 4th Generation: NONREACTIVE

## 2019-06-20 LAB — URINE CYTOLOGY ANCILLARY ONLY
Chlamydia: NEGATIVE
Comment: NEGATIVE
Comment: NORMAL
Neisseria Gonorrhea: NEGATIVE

## 2019-06-20 LAB — RPR: RPR Ser Ql: NONREACTIVE

## 2019-06-20 MED ORDER — LEVOCETIRIZINE DIHYDROCHLORIDE 5 MG PO TABS: 5.0000 mg | ORAL_TABLET | Freq: Every evening | ORAL | 1 refills | Status: DC

## 2019-07-18 ENCOUNTER — Encounter: Payer: Self-pay | Admitting: Family Medicine

## 2019-08-02 ENCOUNTER — Encounter: Payer: Self-pay | Admitting: Family Medicine

## 2019-08-14 ENCOUNTER — Other Ambulatory Visit: Payer: Self-pay | Admitting: Family Medicine

## 2020-01-18 ENCOUNTER — Other Ambulatory Visit: Payer: Self-pay | Admitting: Family Medicine

## 2020-01-27 ENCOUNTER — Other Ambulatory Visit: Payer: Self-pay | Admitting: Family Medicine

## 2020-07-03 ENCOUNTER — Other Ambulatory Visit: Payer: Self-pay | Admitting: Family Medicine

## 2020-07-09 ENCOUNTER — Other Ambulatory Visit: Payer: Self-pay | Admitting: Family Medicine

## 2020-07-15 ENCOUNTER — Other Ambulatory Visit: Payer: Self-pay

## 2020-07-15 NOTE — Progress Notes (Signed)
Office Note 07/16/2020  CC:  Chief Complaint  Patient presents with  . Annual Exam    Pt is fasting    HPI:  Jared Jimenez is a 60 y.o. male who is here for annual health maintenance exam and f/u HTN, HLD. A/P as of last visit 1 yr ago: "Health maintenance exam: Reviewed age and gender appropriate health maintenance issues (prudent diet, regular exercise, health risks of tobacco and excessive alcohol, use of seatbelts, fire alarms in home, use of sunscreen).  Also reviewed age and gender appropriate health screening as well as vaccine recommendations. Vaccines: flu UTD.  Pt declines shingrix. Labs:  Fasting HP, PSA, RPR, HIV, GC/Chl screening today as well as per pt request. Prostate ca screening: Declined DRE today, checking PSA again due to hx of elev PSA/inc PST velocity. Colon ca screening: next colonoscopy due 2030.  High risk sexual behavior: unprotected sex again with new partner (s) since getting divorced. Pt requests STD screening today (see labs above)."  INTERIM HX: Feeling well, compliant with medications. No bp monitoring at home.  Has relocated to [place], [place], looking for new PCP.     Past Medical History:  Diagnosis Date  . Allergy   . ANEMIA DUE TO CHRONIC BLOOD LOSS 05/15/2009  . BPH with obstruction/lower urinary tract symptoms    Dr. Marlou Porch 06/28/17--Flomax trial.  . Diverticulosis of colon 05/2019  . DYSLIPIDEMIA 08/25/2007  . Family history of prostate cancer    Brother. Pt followed by Dr. Laverle Patter.  Marland Kitchen History of gross hematuria 1995   cystoscopy neg  . HYPERTENSION 05/15/2009  . Increased prostate specific antigen (PSA) velocity    06/2018: Rpt at [facility] summer 2020 improved.  06/2019 stable.  . Kidney stones 05/15/2009   Dr. Laverle Patter.  ESWL 2010.  Marland Kitchen Palpitations 07/04/2009  . RECTAL BLEEDING 08/25/2007    Past Surgical History:  Procedure Laterality Date  . COLONOSCOPY  04/08/07; 05/29/19   normal 2008 and 2020 (Dr. Drue Flirt): recall  2030.  Marland Kitchen CYSTOSCOPY W/ URETEROSCOPY W/ LITHOTRIPSY  2010    Family History  Problem Relation Age of Onset  . Prostate cancer Brother   . Hyperlipidemia Brother   . Hypertension Brother   . Hyperlipidemia Mother   . Hypertension Mother   . Lung cancer Father        smoker  . Hypertension Father   . Clotting disorder Son   . Hypertension Brother   . Diabetes Neg Hx   . Colon cancer Neg Hx   . Colon polyps Neg Hx   . Esophageal cancer Neg Hx   . Stomach cancer Neg Hx   . Rectal cancer Neg Hx     Social History   Socioeconomic History  . Marital status: Divorced    Spouse name: Not on file  . Number of children: Not on file  . Years of education: Not on file  . Highest education level: Not on file  Occupational History  . Occupation: Electrical engineer  Tobacco Use  . Smoking status: Never Smoker  . Smokeless tobacco: Never Used  Vaping Use  . Vaping Use: Never used  Substance and Sexual Activity  . Alcohol use: Yes    Alcohol/week: 2.0 standard drinks    Types: 1 Glasses of wine, 1 Cans of beer per week    Comment: 1-2/week  . Drug use: No  . Sexual activity: Not on file  Other Topics Concern  . Not on file  Social History Narrative   Married,  2 sons.   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   No T/A/Ds.   Social Determinants of Health   Financial Resource Strain: Not on file  Food Insecurity: Not on file  Transportation Needs: Not on file  Physical Activity: Not on file  Stress: Not on file  Social Connections: Not on file  Intimate Partner Violence: Not on file    Outpatient Medications Prior to Visit  Medication Sig Dispense Refill  . levocetirizine (XYZAL) 5 MG tablet TAKE 1 TABLET BY MOUTH IN  THE EVENING 30 tablet 5  . lisinopril (ZESTRIL) 20 MG tablet TAKE 1 TABLET BY MOUTH  DAILY 30 tablet 0  . sildenafil (REVATIO) 20 MG tablet TAKE 1 TO 5 TABLETS BY  MOUTH DAILY AS NEEDED 30 TO 60 MIN PRIOR TO INTERCOURSE  45 tablet 1  . simvastatin (ZOCOR) 80 MG tablet TAKE 1 TABLET BY MOUTH  DAILY 90 tablet 1  . tamsulosin (FLOMAX) 0.4 MG CAPS capsule Take 0.4 mg by mouth daily.  11   No facility-administered medications prior to visit.    Allergies  Allergen Reactions  . Diphenhydramine Hcl     REACTION: Swelling in hans, rash    ROS Review of Systems  Constitutional: Negative for appetite change, chills, fatigue and fever.  HENT: Negative for congestion, dental problem, ear pain and sore throat.   Eyes: Negative for discharge, redness and visual disturbance.  Respiratory: Negative for cough, chest tightness, shortness of breath and wheezing.   Cardiovascular: Negative for chest pain, palpitations and leg swelling.  Gastrointestinal: Negative for abdominal pain, blood in stool, diarrhea, nausea and vomiting.  Genitourinary: Negative for difficulty urinating, dysuria, flank pain, frequency, hematuria and urgency.  Musculoskeletal: Negative for arthralgias, back pain, joint swelling, myalgias and neck stiffness.  Skin: Negative for pallor and rash.  Neurological: Negative for dizziness, speech difficulty, weakness and headaches.  Hematological: Negative for adenopathy. Does not bruise/bleed easily.  Psychiatric/Behavioral: Negative for confusion and sleep disturbance. The patient is not nervous/anxious.     PE; Vitals with BMI 07/16/2020 06/19/2019 05/29/2019  Height 6' 0.5" 6' 0.5" -  Weight 209 lbs 211 lbs 13 oz -  BMI 27.94 28.32 -  Systolic 120 126 510  Diastolic 81 85 68  Pulse 93 85 70     Gen: Alert, well appearing.  Patient is oriented to person, place, time, and situation. AFFECT: pleasant, lucid thought and speech. ENT: Ears: EACs clear, normal epithelium.  TMs with good light reflex and landmarks bilaterally.  Eyes: no injection, icteris, swelling, or exudate.  EOMI, PERRLA. Nose: no drainage or turbinate edema/swelling.  No injection or focal lesion.  Mouth: lips without  lesion/swelling.  Oral mucosa pink and moist.  Dentition intact and without obvious caries or gingival swelling.  Oropharynx without erythema, exudate, or swelling.  Neck: supple/nontender.  No LAD, mass, or TM.  Carotid pulses 2+ bilaterally, without bruits. CV: RRR, no m/r/g.   LUNGS: CTA bilat, nonlabored resps, good aeration in all lung fields. ABD: soft, NT, ND, BS normal.  No hepatospenomegaly or mass.  No bruits. EXT: no clubbing, cyanosis, or edema.  Musculoskeletal: no joint swelling, erythema, warmth, or tenderness.  ROM of all joints intact. Skin - no sores or suspicious lesions or rashes or color changes   Pertinent labs:  Lab Results  Component Value Date   TSH 2.03 06/19/2019   Lab Results  Component Value Date   WBC 7.3 06/19/2019   HGB 15.4  06/19/2019   HCT 45.7 06/19/2019   MCV 94.3 06/19/2019   PLT 245.0 06/19/2019   Lab Results  Component Value Date   CREATININE 1.12 06/19/2019   BUN 14 06/19/2019   NA 140 06/19/2019   K 4.3 06/19/2019   CL 105 06/19/2019   CO2 27 06/19/2019   Lab Results  Component Value Date   ALT 26 06/19/2019   AST 19 06/19/2019   ALKPHOS 94 06/19/2019   BILITOT 0.7 06/19/2019   Lab Results  Component Value Date   CHOL 177 06/19/2019   Lab Results  Component Value Date   HDL 44.50 06/19/2019   Lab Results  Component Value Date   LDLCALC 106 (H) 06/19/2019   Lab Results  Component Value Date   TRIG 133.0 06/19/2019   Lab Results  Component Value Date   CHOLHDL 4 06/19/2019   Lab Results  Component Value Date   PSA 2.33 06/19/2019   PSA 2.33 06/19/2019   PSA 2.37 06/14/2018    ASSESSMENT AND PLAN:   1) HTN: controlled on lisinopril 20mg  qd. Lytes/cr today.  2) HLD: tolerating simvastatin 80mg  qd. FLP and hepatic panel today.  3) Health maintenance exam: Reviewed age and gender appropriate health maintenance issues (prudent diet, regular exercise, health risks of tobacco and excessive alcohol, use of  seatbelts, fire alarms in home, use of sunscreen).  Also reviewed age and gender appropriate health screening as well as vaccine recommendations. Vaccines: flu given today.  Covid UTD.  Labs: cbc, cmet, flp, tsh, psa. Pt requests STD screening b/c he is single and sexually active---denies any current sx's of STD and denies past hx of STD. Prostate ca screening: PSA ordered. Colon ca screening: recall 2030.  An After Visit Summary was printed and given to the patient.  FOLLOW UP:  No follow-ups on file.  Signed:  , MD           07/16/2020

## 2020-07-16 ENCOUNTER — Other Ambulatory Visit (HOSPITAL_COMMUNITY)
Admission: RE | Admit: 2020-07-16 | Discharge: 2020-07-16 | Disposition: A | Discharge status: Home / Self Care | Payer: 59 | Source: Ambulatory Visit | Attending: Family Medicine | Admitting: Family Medicine

## 2020-07-16 ENCOUNTER — Ambulatory Visit (INDEPENDENT_AMBULATORY_CARE_PROVIDER_SITE_OTHER): Payer: 59 | Admitting: Family Medicine

## 2020-07-16 ENCOUNTER — Encounter: Payer: Self-pay | Admitting: Family Medicine

## 2020-07-16 VITALS — BP 120/81 | HR 93 | Temp 98.1°F | Resp 16 | Ht 72.5 in | Wt 209.0 lb

## 2020-07-16 DIAGNOSIS — Z125 Encounter for screening for malignant neoplasm of prostate: Secondary | ICD-10-CM

## 2020-07-16 DIAGNOSIS — Z0000 Encounter for general adult medical examination without abnormal findings: Secondary | ICD-10-CM

## 2020-07-16 DIAGNOSIS — I10 Essential (primary) hypertension: Secondary | ICD-10-CM

## 2020-07-16 DIAGNOSIS — Z7251 High risk heterosexual behavior: Secondary | ICD-10-CM | POA: Diagnosis not present

## 2020-07-16 DIAGNOSIS — E7800 Pure hypercholesterolemia, unspecified: Secondary | ICD-10-CM

## 2020-07-16 LAB — COMPREHENSIVE METABOLIC PANEL
ALT: 20 U/L (ref 0–53)
AST: 16 U/L (ref 0–37)
Albumin: 4.4 g/dL (ref 3.5–5.2)
Alkaline Phosphatase: 84 U/L (ref 39–117)
BUN: 19 mg/dL (ref 6–23)
CO2: 25 mEq/L (ref 19–32)
Calcium: 8.9 mg/dL (ref 8.4–10.5)
Chloride: 105 mEq/L (ref 96–112)
Creatinine, Ser: 1.19 mg/dL (ref 0.40–1.50)
GFR: 65.7 mL/min (ref >60.00)
Glucose, Bld: 93 mg/dL (ref 70–99)
Potassium: 4.1 mEq/L (ref 3.5–5.1)
Sodium: 139 mEq/L (ref 135–145)
Total Bilirubin: 0.7 mg/dL (ref 0.2–1.2)
Total Protein: 6.8 g/dL (ref 6.0–8.3)

## 2020-07-16 LAB — CBC WITH DIFFERENTIAL/PLATELET
Basophils Absolute: 0.1 10*3/uL (ref 0.0–0.1)
Basophils Relative: 0.8 % (ref 0.0–3.0)
Eosinophils Absolute: 0.2 10*3/uL (ref 0.0–0.7)
Eosinophils Relative: 2.9 % (ref 0.0–5.0)
HCT: 45 % (ref 39.0–52.0)
Hemoglobin: 14.9 g/dL (ref 13.0–17.0)
Lymphocytes Relative: 30.6 % (ref 12.0–46.0)
Lymphs Abs: 2.1 10*3/uL (ref 0.7–4.0)
MCHC: 33.2 g/dL (ref 30.0–36.0)
MCV: 94.2 fl (ref 78.0–100.0)
Monocytes Absolute: 0.6 10*3/uL (ref 0.1–1.0)
Monocytes Relative: 9.4 % (ref 3.0–12.0)
Neutro Abs: 3.8 10*3/uL (ref 1.4–7.7)
Neutrophils Relative %: 56.3 % (ref 43.0–77.0)
Platelets: 213 10*3/uL (ref 150.0–400.0)
RBC: 4.78 Mil/uL (ref 4.22–5.81)
RDW: 13 % (ref 11.5–15.5)
WBC: 6.8 10*3/uL (ref 4.0–10.5)

## 2020-07-16 LAB — LIPID PANEL
Cholesterol: 147 mg/dL (ref 0–200)
HDL: 39.3 mg/dL (ref >39.00)
LDL Cholesterol: 84 mg/dL (ref 0–99)
NonHDL: 107.9
Total CHOL/HDL Ratio: 4
Triglycerides: 119 mg/dL (ref 0.0–149.0)
VLDL: 23.8 mg/dL (ref 0.0–40.0)

## 2020-07-16 LAB — TSH: TSH: 1.7 u[IU]/mL (ref 0.35–4.50)

## 2020-07-16 LAB — PSA: PSA: 1.89 ng/mL (ref 0.10–4.00)

## 2020-07-16 MED ORDER — LISINOPRIL 20 MG PO TABS: 20.0000 mg | ORAL_TABLET | Freq: Every day | ORAL | 1 refills | Status: DC

## 2020-07-16 NOTE — Patient Instructions (Signed)

## 2020-07-17 LAB — RPR: RPR Ser Ql: NONREACTIVE

## 2020-07-17 LAB — URINE CYTOLOGY ANCILLARY ONLY
Chlamydia: NEGATIVE
Comment: NEGATIVE
Comment: NORMAL
Neisseria Gonorrhea: NEGATIVE

## 2020-07-17 LAB — HIV ANTIBODY (ROUTINE TESTING W REFLEX): HIV 1&2 Ab, 4th Generation: NONREACTIVE

## 2020-11-19 ENCOUNTER — Other Ambulatory Visit: Payer: Self-pay

## 2020-11-19 ENCOUNTER — Telehealth (INDEPENDENT_AMBULATORY_CARE_PROVIDER_SITE_OTHER): Payer: 59 | Admitting: Family Medicine

## 2020-11-19 ENCOUNTER — Encounter: Payer: Self-pay | Admitting: Family Medicine

## 2020-11-19 DIAGNOSIS — J069 Acute upper respiratory infection, unspecified: Secondary | ICD-10-CM

## 2020-11-19 DIAGNOSIS — J209 Acute bronchitis, unspecified: Secondary | ICD-10-CM

## 2020-11-19 DIAGNOSIS — J019 Acute sinusitis, unspecified: Secondary | ICD-10-CM | POA: Diagnosis not present

## 2020-11-19 DIAGNOSIS — R062 Wheezing: Secondary | ICD-10-CM

## 2020-11-19 MED ORDER — ALBUTEROL SULFATE HFA 108 (90 BASE) MCG/ACT IN AERS: 2.0000 | INHALATION_SPRAY | RESPIRATORY_TRACT | 0 refills | Status: DC | PRN

## 2020-11-19 MED ORDER — DOXYCYCLINE HYCLATE 100 MG PO CAPS: 100.0000 mg | ORAL_CAPSULE | Freq: Two times a day (BID) | ORAL | 0 refills | Status: AC

## 2020-11-19 MED ORDER — PREDNISONE 20 MG PO TABS
ORAL_TABLET | ORAL | 0 refills | Status: DC
Start: 2020-11-19 — End: 2020-12-02

## 2020-11-19 NOTE — Progress Notes (Signed)
Virtual Visit via Video Note  I connected with pt on 11/19/20 at 10:00 AM EDT by a video enabled telemedicine application and verified that I am speaking with the correct person using two identifiers.  Location patient: home, [place] Location provider:work or home office Persons participating in the virtual visit: patient, provider  I discussed the limitations of evaluation and management by telemedicine and the availability of in person appointments. The patient expressed understanding and agreed to proceed.  Telemedicine visit is a necessity given the COVID-19 restrictions in place at the current time.  HPI: 60 y/o WM being seen for respiratory complaints. Onset 7-8 d/a nasal congestion, sinus pressure, cough that is rattly and associated with some difficulty getting a deep inhalation.  He does hear chest wheezing but no rapid/labored resps.  Low grade fever (Tm 99.7) hs.  Sx's not abating.  In fact, upper teeth pain/throbbing has started lately and sinus pressure worse. Fortunately, no fatigue or malaise. No CP, no n/v/d.  Home covid test neg 2 d/a. He is fully vaccinated for covid .    BMI 28.  ROS: See pertinent positives and negatives per HPI.  Past Medical History:  Diagnosis Date   Allergy    BPH with obstruction/lower urinary tract symptoms    Dr. Marlou Porch 06/28/17--Flomax trial.   Diverticulosis of colon 05/2019   DYSLIPIDEMIA 08/25/2007   Family history of prostate cancer    Brother. Pt followed by Dr. Laverle Patter.   Gustatory rhinitis    History of gross hematuria 1995   cystoscopy neg   HYPERTENSION 05/15/2009   Increased prostate specific antigen (PSA) velocity    06/2018: Rpt at [facility] summer 2020 improved.  06/2019 stable.   Kidney stones 05/15/2009   Dr. Laverle Patter.  ESWL 2010.   Palpitations 07/04/2009   RECTAL BLEEDING 08/25/2007    Past Surgical History:  Procedure Laterality Date   COLONOSCOPY  04/08/07; 05/29/19   normal 2008 and 2020 (Dr. Drue Flirt): recall 2030.    CYSTOSCOPY W/ URETEROSCOPY W/ LITHOTRIPSY  2010   Social History   Socioeconomic History   Marital status: Divorced    Spouse name: Not on file   Number of children: Not on file   Years of education: Not on file   Highest education level: Not on file  Occupational History   Occupation: Electrical engineer  Tobacco Use   Smoking status: Never   Smokeless tobacco: Never  Vaping Use   Vaping Use: Never used  Substance and Sexual Activity   Alcohol use: Yes    Alcohol/week: 2.0 standard drinks    Types: 1 Glasses of wine, 1 Cans of beer per week    Comment: 1-2/week   Drug use: No   Sexual activity: Not on file  Other Topics Concern   Not on file  Social History Narrative   Married, 2 sons.   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   No T/A/Ds.   Social Determinants of Health   Financial Resource Strain: Not on file  Food Insecurity: Not on file  Transportation Needs: Not on file  Physical Activity: Not on file  Stress: Not on file  Social Connections: Not on file     Current Outpatient Medications:    levocetirizine (XYZAL) 5 MG tablet, TAKE 1 TABLET BY MOUTH IN  THE EVENING, Disp: 30 tablet, Rfl: 5   lisinopril (ZESTRIL) 20 MG tablet, Take 1 tablet (20 mg total) by mouth daily., Disp: 90 tablet, Rfl: 1  sildenafil (REVATIO) 20 MG tablet, TAKE 1 TO 5 TABLETS BY  MOUTH DAILY AS NEEDED 30 TO 60 MIN PRIOR TO INTERCOURSE, Disp: 45 tablet, Rfl: 1   simvastatin (ZOCOR) 80 MG tablet, TAKE 1 TABLET BY MOUTH  DAILY, Disp: 90 tablet, Rfl: 1   tamsulosin (FLOMAX) 0.4 MG CAPS capsule, Take 0.4 mg by mouth daily., Disp: , Rfl: 11  EXAM:  VITALS per patient if applicable:  Vitals with BMI 07/16/2020 06/19/2019 05/29/2019  Height 6' 0.5" 6' 0.5" -  Weight 209 lbs 211 lbs 13 oz -  BMI 27.94 28.32 -  Systolic 120 126 425  Diastolic 81 85 68  Pulse 93 85 70     GENERAL: alert, oriented, appears well and in no acute distress  HEENT:  atraumatic, conjunttiva clear, no obvious abnormalities on inspection of external nose and ears  NECK: normal movements of the head and neck  LUNGS: on inspection no signs of respiratory distress, breathing rate appears normal, no obvious gross SOB, gasping or wheezing  CV: no obvious cyanosis  MS: moves all visible extremities without noticeable abnormality  PSYCH/NEURO: pleasant and cooperative, no obvious depression or anxiety, speech and thought processing grossly intact  LABS: none today    Chemistry      Component Value Date/Time   NA 139 07/16/2020 0826   K 4.1 07/16/2020 0826   CL 105 07/16/2020 0826   CO2 25 07/16/2020 0826   BUN 19 07/16/2020 0826   CREATININE 1.19 07/16/2020 0826      Component Value Date/Time   CALCIUM 8.9 07/16/2020 0826   CALCIUM 9.0 05/15/2009 1348   ALKPHOS 84 07/16/2020 0826   AST 16 07/16/2020 0826   ALT 20 07/16/2020 0826   BILITOT 0.7 07/16/2020 0826      ASSESSMENT AND PLAN:  Discussed the following assessment and plan:  Prolonged URI with asthmatic bronchitis. Suspect he now has acute bacterial sinusitis. Will rx doxycycline 100mg  bid x 7d, prednisone 40 mg qd x 5d, and albuterol hfa 1-2p q4h prn.  Continue mucinex DM and xyzal.   I discussed the assessment and treatment plan with the patient. The patient was provided an opportunity to ask questions and all were answered. The patient agreed with the plan and demonstrated an understanding of the instructions.   F/u: if not signif imp in 4-5d  Signed:  , MD           11/19/2020

## 2020-12-01 ENCOUNTER — Encounter: Payer: Self-pay | Admitting: Family Medicine

## 2020-12-02 ENCOUNTER — Telehealth: Payer: Self-pay | Admitting: Family Medicine

## 2020-12-02 MED ORDER — DOXYCYCLINE HYCLATE 100 MG PO CAPS: 100.0000 mg | ORAL_CAPSULE | Freq: Two times a day (BID) | ORAL | 0 refills | Status: AC

## 2020-12-02 MED ORDER — PREDNISONE 20 MG PO TABS: ORAL_TABLET | ORAL | 0 refills | Status: DC

## 2020-12-02 NOTE — Telephone Encounter (Signed)
OK, I'll send in 7 more days of doxy and a 10d prednisone taper.   OTC mucinex dm for cough. Make sure he is using saline nasal spray 2 times daily and I suggest he buy otc flonase and use 2 sprays in each nostril every day until he gets fully over this.

## 2020-12-02 NOTE — Telephone Encounter (Signed)
I've addressed this in a phone message already.

## 2020-12-02 NOTE — Telephone Encounter (Signed)
Please advise 

## 2020-12-02 NOTE — Telephone Encounter (Signed)
Spoke with pt to advise of recommendations, he said he did at home test for covid and it was positive. Appt set with different provider for tomorrow, 6/28

## 2020-12-02 NOTE — Telephone Encounter (Signed)
Pt was given rx for albuterol inhaler and 5d taper of prednisone during 6/14 appt  Please Advise.

## 2020-12-02 NOTE — Telephone Encounter (Signed)
Jared Jimenez 2 hours ago (8:15 AM)   AD  Pt called and said that he had an appt on 11/19/20 and he said he still has Bronchitis, He said the meds were helping but when he ran out it started to come back. What should he do?

## 2020-12-02 NOTE — Telephone Encounter (Signed)
Pt called and said that he had an appt on 11/19/20 and he said he still has Bronchitis, He said the meds were helping but when he ran out it started to come back. What should he do?

## 2020-12-03 ENCOUNTER — Encounter: Payer: Self-pay | Admitting: Registered Nurse

## 2020-12-03 ENCOUNTER — Other Ambulatory Visit: Payer: Self-pay

## 2020-12-03 ENCOUNTER — Telehealth (INDEPENDENT_AMBULATORY_CARE_PROVIDER_SITE_OTHER): Payer: 59 | Admitting: Registered Nurse

## 2020-12-03 VITALS — Temp 101.0°F | Wt 204.0 lb

## 2020-12-03 DIAGNOSIS — U071 COVID-19: Secondary | ICD-10-CM | POA: Diagnosis not present

## 2020-12-03 MED ORDER — DM-GUAIFENESIN ER 30-600 MG PO TB12: 1.0000 | ORAL_TABLET | Freq: Two times a day (BID) | ORAL | 0 refills | Status: DC

## 2020-12-03 NOTE — Patient Instructions (Signed)
° ° ° °  If you have lab work done today you will be contacted with your lab results within the next 2 weeks.  If you have not heard from us then please contact us. The fastest way to get your results is to register for My Chart. ° ° °IF you received an x-ray today, you will receive an invoice from [facility]. Please contact [facility] at [phone] with questions or concerns regarding your invoice.  ° °IF you received labwork today, you will receive an invoice from [facility]. Please contact [facility] at [phone] with questions or concerns regarding your invoice.  ° °Our billing staff will not be able to assist you with questions regarding bills from these companies. ° °You will be contacted with the lab results as soon as they are available. The fastest way to get your results is to activate your My Chart account. Instructions are located on the last page of this paperwork. If you have not heard from us regarding the results in 2 weeks, please contact this office. °  ° ° ° °

## 2020-12-03 NOTE — Progress Notes (Signed)
Telemedicine Encounter- SOAP NOTE Established Patient  This telephone encounter was conducted with the patient's (or proxy's) verbal consent via audio telecommunications: yes/no: Yes Patient was instructed to have this encounter in a suitably private space; and to only have persons present to whom they give permission to participate. In addition, patient identity was confirmed by use of name plus two identifiers (DOB and address).  I discussed the limitations, risks, security and privacy concerns of performing an evaluation and management service by telephone and the availability of in person appointments. I also discussed with the patient that there may be a patient responsible charge related to this service. The patient expressed understanding and agreed to proceed.  I spent a total of TIME; 0 MIN TO 60 MIN: 15 minutes talking with the patient or their proxy.  Patient at home Provider in office  Participants: Jared Sportsman, NP and Aniceto Boss  Chief Complaint  Patient presents with   Covid Positive    Patient states he tested positive for covid 12/02/2020. He is experiencing a fever of 101 and a cough are his only symptoms.    Subjective   Jared Jimenez is a 60 y.o. established patient. 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 Marlou Porch 06/28/17--Flomax trial.   Diverticulosis of colon 05/2019   DYSLIPIDEMIA 08/25/2007   Family history of prostate cancer    Brother. Pt followed by Dr. Laverle Patter.   Gustatory rhinitis    History of gross hematuria 1995   cystoscopy neg   HYPERTENSION 05/15/2009   Increased prostate specific antigen (PSA) velocity    06/2018: Rpt at [facility] summer 2020 improved.  06/2019 stable.   Kidney stones 05/15/2009   Dr. Laverle Patter.  ESWL 2010.   Palpitations 07/04/2009   RECTAL BLEEDING 08/25/2007    Current Outpatient Medications  Medication Sig Dispense Refill   albuterol (VENTOLIN HFA) 108 (90 Base) MCG/ACT inhaler Inhale 2 puffs into the lungs every 4 (four) hours as needed for wheezing or shortness of breath. 1 each 0   doxycycline (VIBRAMYCIN) 100 MG capsule Take 1 capsule (100 mg total) by mouth 2 (two) times daily for 7 days. 14 capsule 0   levocetirizine (XYZAL) 5 MG tablet TAKE 1 TABLET BY MOUTH IN  THE EVENING 30 tablet 5   lisinopril (ZESTRIL) 20 MG tablet Take 1 tablet (20 mg total) by mouth daily. 90 tablet 1   predniSONE (DELTASONE) 20 MG tablet 2 tabs po qd x 5d then 1 tab po qd x 5d 15 tablet 0   sildenafil (REVATIO) 20 MG tablet TAKE 1 TO 5 TABLETS BY  MOUTH DAILY AS NEEDED 30 TO 60 MIN PRIOR TO INTERCOURSE 45 tablet 1   simvastatin (  ZOCOR) 80 MG tablet TAKE 1 TABLET BY MOUTH  DAILY 90 tablet 1   tamsulosin (FLOMAX) 0.4 MG CAPS capsule Take 0.4 mg by mouth daily.  11   No current facility-administered medications for this visit.    Allergies  Allergen Reactions   Diphenhydramine Hcl     REACTION: Swelling in hans, rash    Social History   Socioeconomic History   Marital status: Divorced    Spouse name: Not on file   Number of children: Not on file   Years of education: Not on file   Highest education level: Not on file  Occupational History   Occupation: Producer, television/film/video  Tobacco Use   Smoking status: Never   Smokeless tobacco: Never  Vaping Use   Vaping Use: Never used  Substance and Sexual Activity   Alcohol use: Yes    Alcohol/week: 2.0 standard drinks    Types: 1 Glasses of wine, 1 Cans of beer per week    Comment: 1-2/week   Drug use: No   Sexual activity: Not on file  Other Topics Concern   Not on file  Social History Narrative   Married, 2 sons.   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   No T/A/Ds.   Social Determinants of Health   Financial Resource Strain: Not on file  Food Insecurity: Not on file  Transportation Needs: Not on file  Physical Activity: Not on file  Stress: Not on file  Social Connections: Not on file  Intimate Partner Violence: Not on file    ROS  Objective   Vitals as reported by the patient: Today's Vitals   12/03/20 1143  Temp: (!) 101 F (38.3 C)  TempSrc: Temporal  Weight: 204 lb (92.5 kg)    Jared Jimenez was seen today for covid positive.  Diagnoses and all orders for this visit:  COVID-19 -     Discontinue: dextromethorphan-guaiFENesin (MUCINEX DM) 30-600 MG 12hr tablet; Take 1 tablet by mouth 2 (two) times daily. -     dextromethorphan-guaiFENesin (MUCINEX DM) 30-600 MG 12hr tablet; Take 1 tablet by mouth 2 (two) times daily.   PLAN Discussed options for care -antivirals vs supportive care. Pt opts for supportive care. Given his improvement and overall health agree this is reasonable He will pick up prednisone burst that Dr. Milinda Cave sent for him - 40mg  PO qd for 5 days. I will send mucinex DM. Ok to continue aleve or tylenol for any aches or fever.  Discussed ER precautions and reasons to return to clinic. Pt voices understanding. Patient encouraged to call clinic with any questions, comments, or concerns.  I discussed the assessment and treatment plan with the patient. The patient was provided an opportunity to ask questions and all were answered. The  patient agreed with the plan and demonstrated an understanding of the instructions.   The patient was advised to call back or seek an in-person evaluation if the symptoms worsen or if the condition fails to improve as anticipated.  I provided 15 minutes of non-face-to-face time during this encounter.  , NP  Primary [facility] at [facility]

## 2020-12-03 NOTE — Telephone Encounter (Signed)
December 02, 2020  Jared Jimenez D, CMA      5:07 PM Note Spoke with pt to advise of recommendations, he said he did at home test for covid and it was positive. Appt set with different provider for tomorrow, 6/28

## 2020-12-27 ENCOUNTER — Other Ambulatory Visit: Payer: Self-pay

## 2020-12-27 MED ORDER — LISINOPRIL 20 MG PO TABS: 20.0000 mg | ORAL_TABLET | Freq: Every day | ORAL | 0 refills | Status: DC

## 2021-01-30 ENCOUNTER — Other Ambulatory Visit: Payer: Self-pay | Admitting: Family Medicine

## 2021-01-31 ENCOUNTER — Other Ambulatory Visit: Payer: Self-pay

## 2021-01-31 ENCOUNTER — Encounter: Payer: Self-pay | Admitting: Family Medicine

## 2021-01-31 ENCOUNTER — Telehealth (INDEPENDENT_AMBULATORY_CARE_PROVIDER_SITE_OTHER): Payer: 59 | Admitting: Family Medicine

## 2021-01-31 VITALS — Wt 205.0 lb

## 2021-01-31 DIAGNOSIS — R058 Other specified cough: Secondary | ICD-10-CM | POA: Diagnosis not present

## 2021-01-31 DIAGNOSIS — J4521 Mild intermittent asthma with (acute) exacerbation: Secondary | ICD-10-CM | POA: Diagnosis not present

## 2021-01-31 DIAGNOSIS — R062 Wheezing: Secondary | ICD-10-CM | POA: Diagnosis not present

## 2021-01-31 MED ORDER — PREDNISONE 20 MG PO TABS: ORAL_TABLET | ORAL | 0 refills | Status: DC

## 2021-01-31 MED ORDER — TAMSULOSIN HCL 0.4 MG PO CAPS: 0.4000 mg | ORAL_CAPSULE | Freq: Every day | ORAL | 3 refills | Status: DC

## 2021-01-31 MED ORDER — BENZONATATE 200 MG PO CAPS
200.0000 mg | ORAL_CAPSULE | Freq: Three times a day (TID) | ORAL | 1 refills | Status: DC | PRN
Start: 2021-01-31 — End: 2021-07-17

## 2021-01-31 NOTE — Progress Notes (Signed)
Virtual Visit via Video Note  I connected with pt on 01/31/21 at 10:30 AM EDT by a video enabled telemedicine application and verified that I am speaking with the correct person using two identifiers.  Location patient: home, [place] Location provider:work or home office Persons participating in the virtual visit: patient, provider  I discussed the limitations of evaluation and management by telemedicine and the availability of in person appointments. The patient expressed understanding and agreed to proceed.  Telemedicine visit is a necessity given the COVID-19 restrictions in place at the current time.  HPI: 60 y/o male being seen today for cough. He had a covid + resp illness 12/02/20, rx'd prednisone and antiviral, got much better, just mild cough residual but this cough has been getting worse the last 1 wk. Keeps him up at night. Wheezing, feels some sense that he doesn't get deep breaths well.  +Rattly chest, productive cough. Albuterol helps.  No fevers. Slight HA last night but not much prob with this.  No face pain or ST.  ROS: See pertinent positives and negatives per HPI.  Past Medical History:  Diagnosis Date   Allergy    BPH with obstruction/lower urinary tract symptoms    Dr. Marlou Porch 06/28/17--Flomax trial.   Diverticulosis of colon 05/2019   DYSLIPIDEMIA 08/25/2007   Family history of prostate cancer    Brother. Pt followed by Dr. Laverle Patter.   Gustatory rhinitis    History of gross hematuria 1995   cystoscopy neg   HYPERTENSION 05/15/2009   Increased prostate specific antigen (PSA) velocity    06/2018: Rpt at [facility] summer 2020 improved.  06/2019 stable.   Kidney stones 05/15/2009   Dr. Laverle Patter.  ES[facility] 2010.   Palpitations 07/04/2009   RECTAL BLEEDING 08/25/2007    Past Surgical History:  Procedure Laterality Date   COLONOSCOPY  04/08/07; 05/29/19   normal 2008 and 2020 (Dr. Drue Flirt): recall 2030.   CYSTOSCOPY W/ URETEROSCOPY W/ LITHOTRIPSY  2010     Current  Outpatient Medications:    albuterol (VENTOLIN HFA) 108 (90 Base) MCG/ACT inhaler, Inhale 2 puffs into the lungs every 4 (four) hours as needed for wheezing or shortness of breath., Disp: 1 each, Rfl: 0   dextromethorphan-guaiFENesin (MUCINEX DM) 30-600 MG 12hr tablet, Take 1 tablet by mouth 2 (two) times daily., Disp: 20 tablet, Rfl: 0   levocetirizine (XYZAL) 5 MG tablet, TAKE 1 TABLET BY MOUTH IN  THE EVENING, Disp: 30 tablet, Rfl: 5   lisinopril (ZESTRIL) 20 MG tablet, Take 1 tablet (20 mg total) by mouth daily., Disp: 30 tablet, Rfl: 0   predniSONE (DELTASONE) 20 MG tablet, 2 tabs po qd x 5d then 1 tab po qd x 5d, Disp: 15 tablet, Rfl: 0   sildenafil (REVATIO) 20 MG tablet, TAKE 1 TO 5 TABLETS BY  MOUTH DAILY AS NEEDED 30 TO 60 MIN PRIOR TO INTERCOURSE, Disp: 45 tablet, Rfl: 1   simvastatin (ZOCOR) 80 MG tablet, TAKE 1 TABLET BY MOUTH  DAILY, Disp: 90 tablet, Rfl: 1   tamsulosin (FLOMAX) 0.4 MG CAPS capsule, Take 0.4 mg by mouth daily., Disp: , Rfl: 11  EXAM:  VITALS per patient if applicable:  Vitals with BMI 12/03/2020 07/16/2020 06/19/2019  Height - 6' 0.5" 6' 0.5"  Weight 204 lbs 209 lbs 211 lbs 13 oz  BMI - 27.94 28.32  Systolic - 120 126  Diastolic - 81 85  Pulse - 93 85    GENERAL: alert, oriented, appears well and in no acute distress  HEENT: atraumatic, conjunttiva clear, no obvious abnormalities on inspection of external nose and ears  NECK: normal movements of the head and neck  LUNGS: on inspection no signs of respiratory distress, breathing rate appears normal, no obvious gross SOB, gasping or wheezing  CV: no obvious cyanosis  MS: moves all visible extremities without noticeable abnormality  PSYCH/NEURO: pleasant and cooperative, no obvious depression or anxiety, speech and thought processing grossly intact  LABS: none today    Chemistry      Component Value Date/Time   NA 139 07/16/2020 0826   K 4.1 07/16/2020 0826   CL 105 07/16/2020 0826   CO2 25  07/16/2020 0826   BUN 19 07/16/2020 0826   CREATININE 1.19 07/16/2020 0826      Component Value Date/Time   CALCIUM 8.9 07/16/2020 0826   CALCIUM 9.0 05/15/2009 1348   ALKPHOS 84 07/16/2020 0826   AST 16 07/16/2020 0826   ALT 20 07/16/2020 0826   BILITOT 0.7 07/16/2020 0826     ASSESSMENT AND PLAN:  Discussed the following assessment and plan:  1) Postviral cough, now some RAD now.  Covid illness 2 mo ago. No sign of new viral or bacterial infection. Will rx prednisone 40mg  qd x 5d, continue albuterol q4h prn, and try tessalon pearles 200 mg tid prn cough.  I discussed the assessment and treatment plan with the patient. The patient was provided an opportunity to ask questions and all were answered. The patient agreed with the plan and demonstrated an understanding of the instructions.   Signed:  , MD           01/31/2021

## 2021-02-04 ENCOUNTER — Other Ambulatory Visit: Payer: Self-pay | Admitting: Family Medicine

## 2021-02-11 ENCOUNTER — Telehealth: Payer: Self-pay

## 2021-02-11 ENCOUNTER — Other Ambulatory Visit: Payer: Self-pay | Admitting: Family Medicine

## 2021-02-11 NOTE — Telephone Encounter (Signed)
Patient refill request.  Patient still has cough, would like another round of prednisone  predniSONE (DELTASONE) 20 MG tablet [[id number]]   [id number] - [place]

## 2021-02-12 ENCOUNTER — Other Ambulatory Visit: Payer: Self-pay | Admitting: Family Medicine

## 2021-02-12 NOTE — Telephone Encounter (Signed)
OK, pls eRx prednisone 20mg , 2 tabs po qd x 5d, then 1 tab po qd x 5d, #15, no RF. If cough still a significant problem after this then I'll need to see him and we'll likely need to get a chest x-ray.

## 2021-02-12 NOTE — Telephone Encounter (Signed)
Please advise 

## 2021-02-13 NOTE — Telephone Encounter (Signed)
Pt has picked up rx for prednisone but informed of recommendations if cough persist. He voiced understanding.

## 2021-02-22 ENCOUNTER — Other Ambulatory Visit: Payer: Self-pay | Admitting: Family Medicine

## 2021-04-11 IMAGING — US RIGHT LOWER EXTREMITY VENOUS ULTRASOUND
1 series · 13 of 24 positions shown · non-contrast
Comparison: None.

CLINICAL DATA: Acute right leg pain. Right lower leg tightness and
numbness.



[Series 1: right lower extremity venous ultrasound · 13 of 32 slices shown]
[im 1/32]
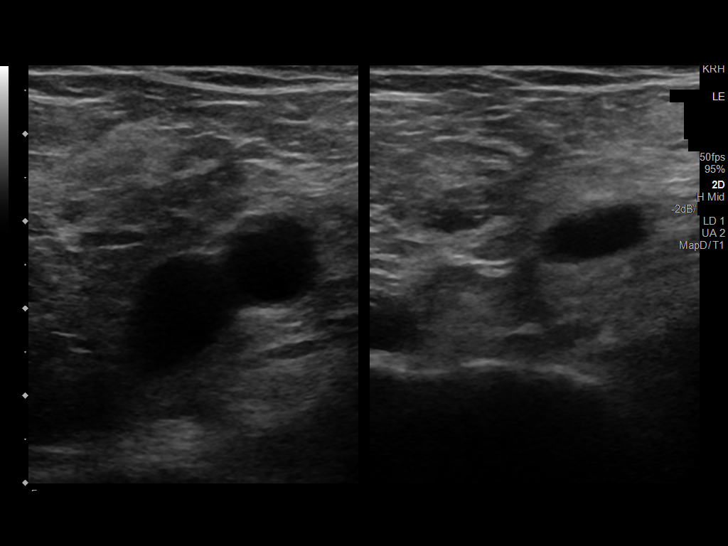
[im 3/32]
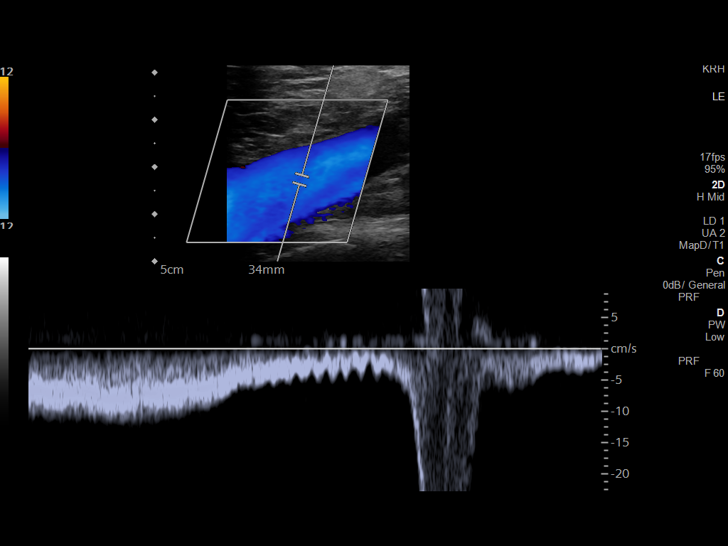
[im 6/32]
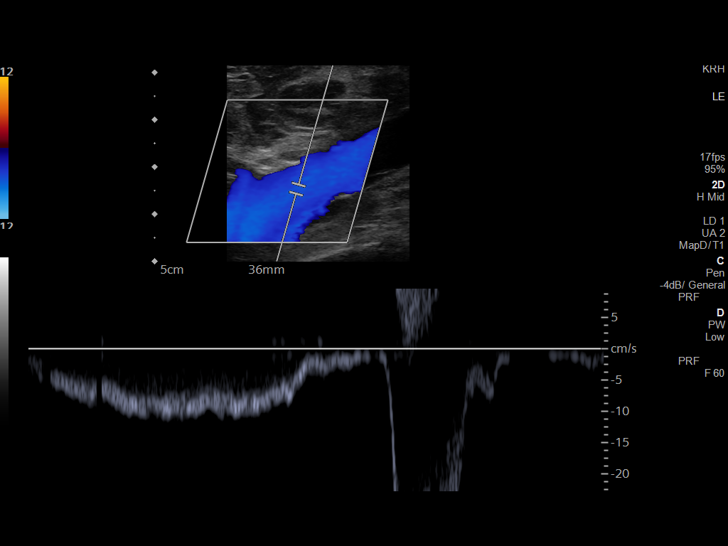
[im 9/32]
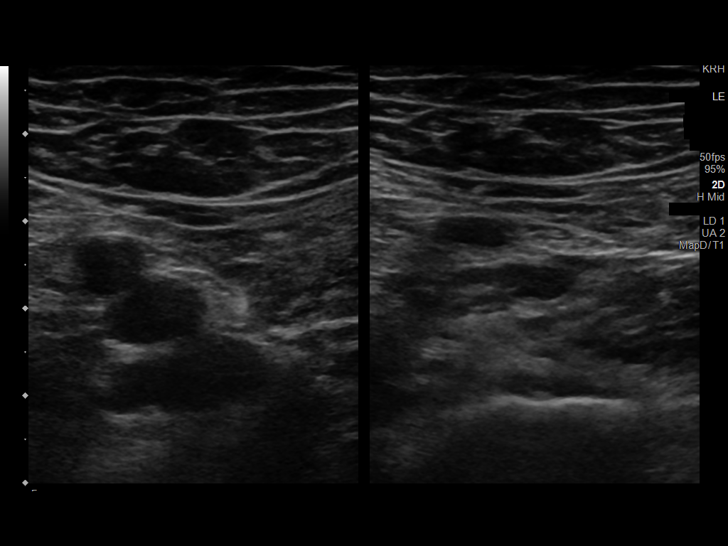
[im 11/32]
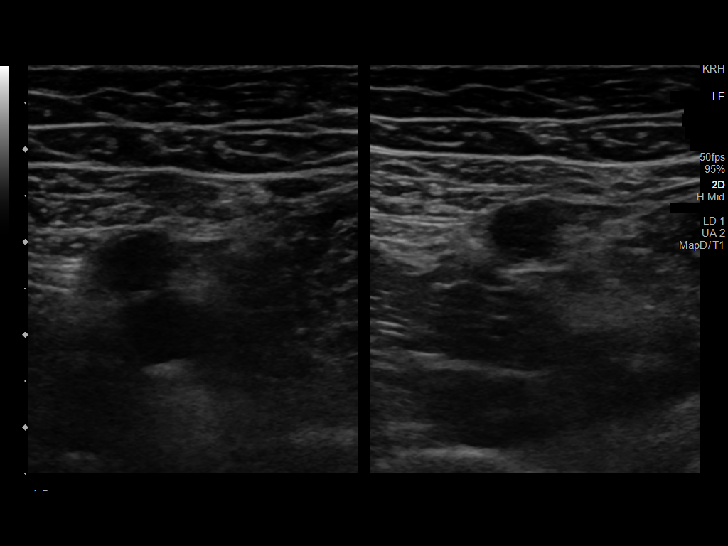
[im 14/32]
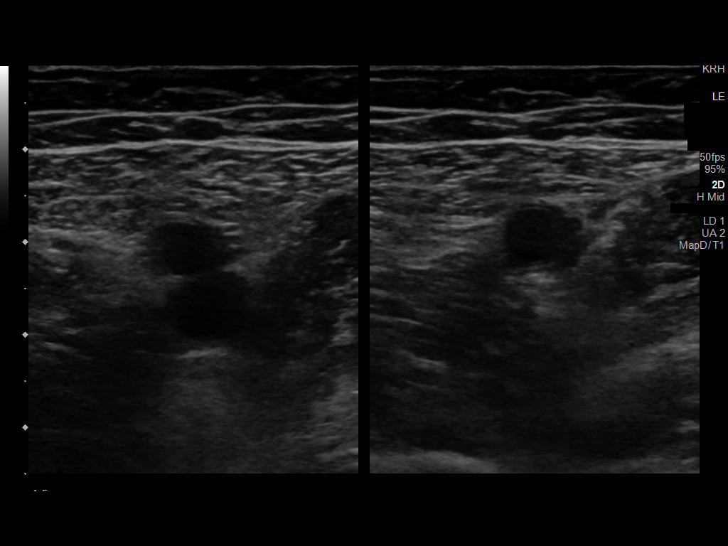
[im 17/32]
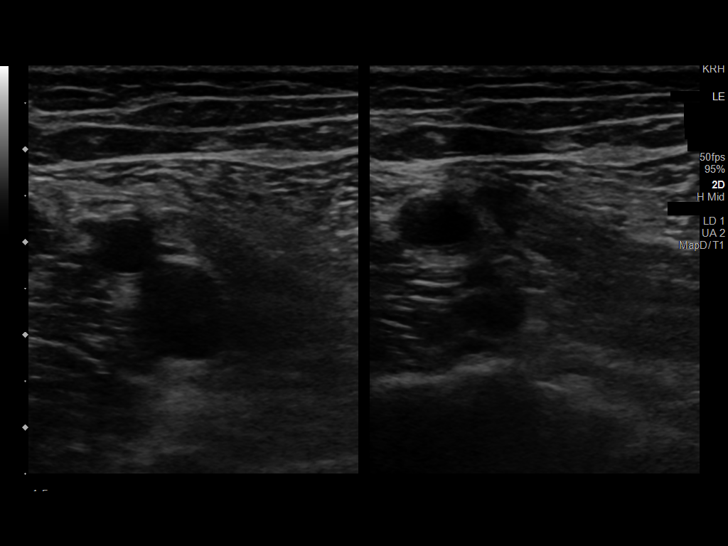
[im 18/32]
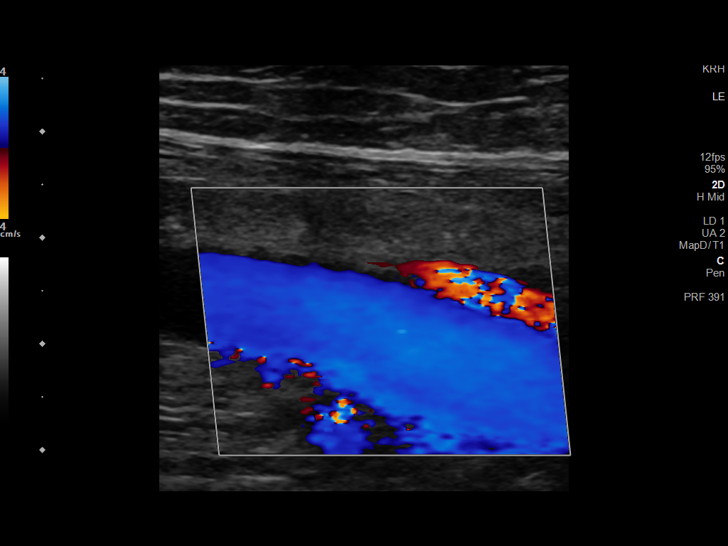
[im 21/32]
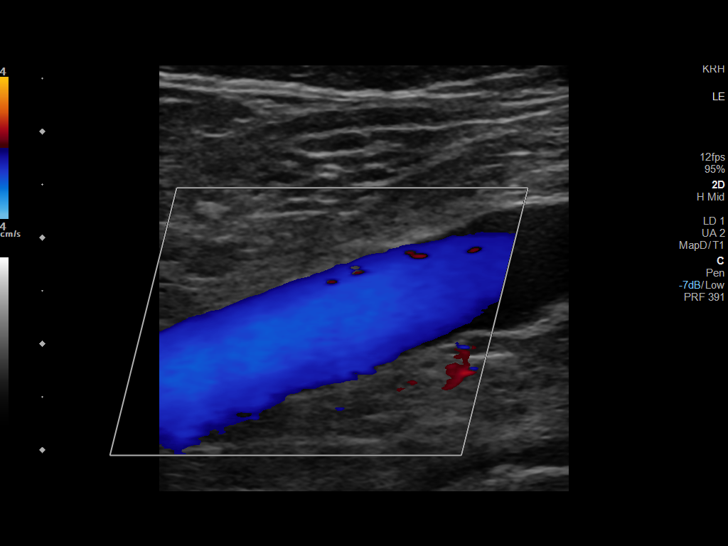
[im 23/32]
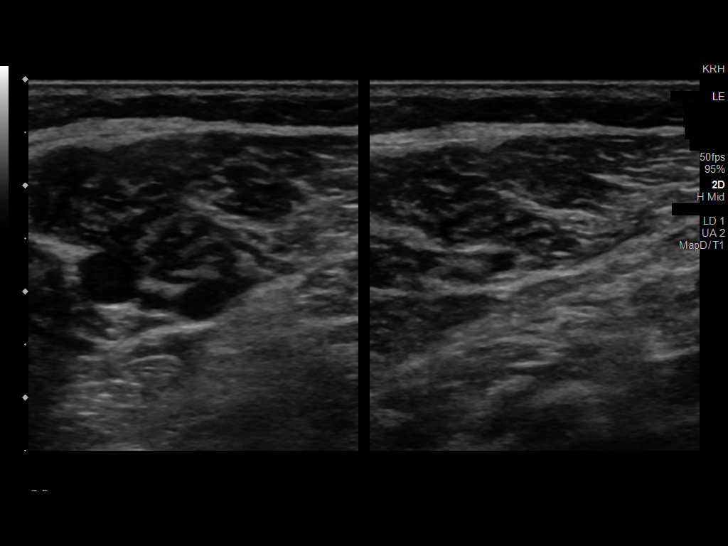
[im 26/32]
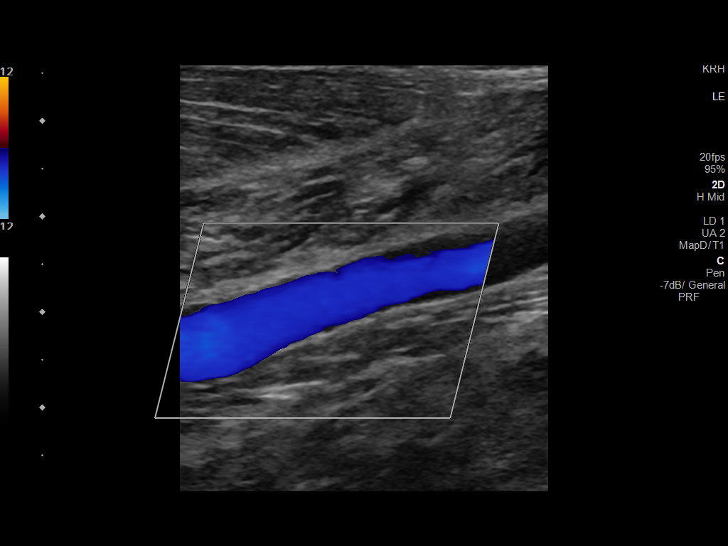
[im 29/32]
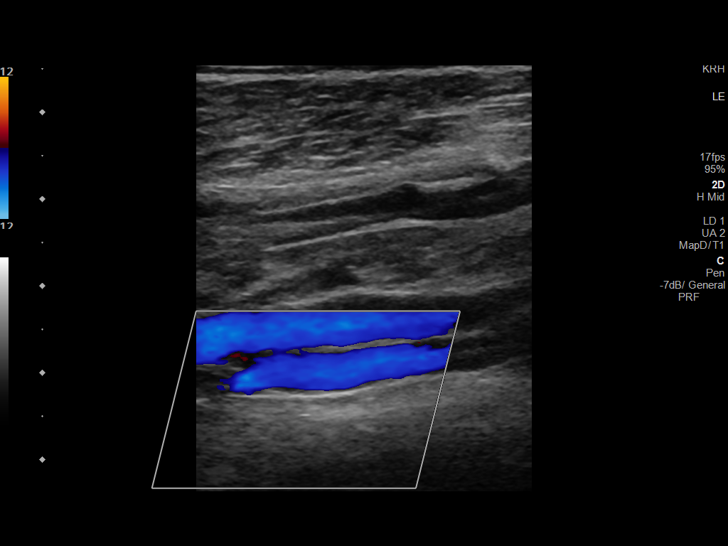
[im 32/32]
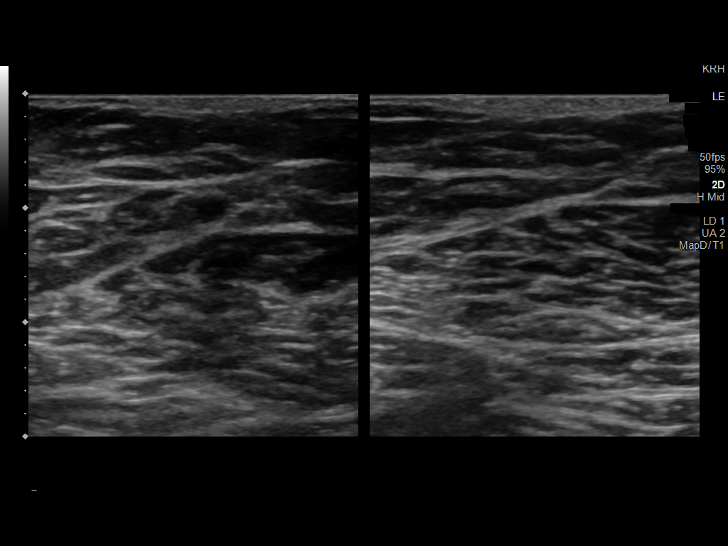

[13 of 24 positions shown; findings below may reference images not displayed]

FINDINGS: Contralateral Common Femoral Vein: Respiratory phasicity is normal
and symmetric with the symptomatic side. No evidence of thrombus.
Normal compressibility.

Common Femoral Vein: No evidence of thrombus. Normal
compressibility, respiratory phasicity and response to augmentation.

Saphenofemoral Junction: No evidence of thrombus. Normal
compressibility and flow on color Doppler imaging.

Profunda Femoral Vein: No evidence of thrombus. Normal
compressibility and flow on color Doppler imaging.

Femoral Vein: No evidence of thrombus. Normal compressibility,
respiratory phasicity and response to augmentation.

Popliteal Vein: No evidence of thrombus. Normal compressibility,
respiratory phasicity and response to augmentation.

Calf Veins: No evidence of thrombus. Normal compressibility and flow
on color Doppler imaging.

Superficial Great Saphenous Vein: No evidence of thrombus. Normal
compressibility.

Venous Reflux:  None.

Other Findings:  None.
IMPRESSION: No evidence of right lower extremity deep venous thrombosis.

## 2021-05-07 ENCOUNTER — Telehealth: Payer: Self-pay

## 2021-05-07 ENCOUNTER — Other Ambulatory Visit: Payer: Self-pay

## 2021-05-07 MED ORDER — LISINOPRIL 20 MG PO TABS: 20.0000 mg | ORAL_TABLET | Freq: Every day | ORAL | 1 refills | Status: DC

## 2021-05-07 MED ORDER — SIMVASTATIN 80 MG PO TABS
80.0000 mg | ORAL_TABLET | Freq: Every day | ORAL | 1 refills | Status: DC
Start: 2021-05-07 — End: 2021-07-17

## 2021-05-07 MED ORDER — SILDENAFIL CITRATE 20 MG PO TABS: ORAL_TABLET | ORAL | 1 refills | Status: DC

## 2021-05-07 MED ORDER — LEVOCETIRIZINE DIHYDROCHLORIDE 5 MG PO TABS: 5.0000 mg | ORAL_TABLET | Freq: Every evening | ORAL | 1 refills | Status: DC

## 2021-05-07 NOTE — Telephone Encounter (Signed)
Spoke with pt to confirm pharmacy change and refills requested. Pt confirmed and pharmacy updated.

## 2021-07-17 ENCOUNTER — Other Ambulatory Visit: Payer: Self-pay

## 2021-07-17 ENCOUNTER — Other Ambulatory Visit (HOSPITAL_COMMUNITY)
Admission: RE | Admit: 2021-07-17 | Discharge: 2021-07-17 | Disposition: A | Discharge status: Home / Self Care | Payer: BLUE CROSS/BLUE SHIELD | Source: Ambulatory Visit | Attending: Family Medicine | Admitting: Family Medicine

## 2021-07-17 ENCOUNTER — Encounter: Payer: Self-pay | Admitting: Family Medicine

## 2021-07-17 ENCOUNTER — Ambulatory Visit (INDEPENDENT_AMBULATORY_CARE_PROVIDER_SITE_OTHER): Payer: BLUE CROSS/BLUE SHIELD | Admitting: Family Medicine

## 2021-07-17 VITALS — BP 126/87 | HR 82 | Temp 98.0°F | Ht 74.61 in | Wt 215.8 lb

## 2021-07-17 DIAGNOSIS — Z23 Encounter for immunization: Secondary | ICD-10-CM | POA: Diagnosis not present

## 2021-07-17 DIAGNOSIS — Z0000 Encounter for general adult medical examination without abnormal findings: Secondary | ICD-10-CM | POA: Diagnosis not present

## 2021-07-17 DIAGNOSIS — E7800 Pure hypercholesterolemia, unspecified: Secondary | ICD-10-CM

## 2021-07-17 DIAGNOSIS — Z113 Encounter for screening for infections with a predominantly sexual mode of transmission: Secondary | ICD-10-CM | POA: Insufficient documentation

## 2021-07-17 DIAGNOSIS — I10 Essential (primary) hypertension: Secondary | ICD-10-CM | POA: Diagnosis not present

## 2021-07-17 DIAGNOSIS — Z125 Encounter for screening for malignant neoplasm of prostate: Secondary | ICD-10-CM | POA: Diagnosis not present

## 2021-07-17 LAB — CBC WITH DIFFERENTIAL/PLATELET
Basophils Absolute: 0 10*3/uL (ref 0.0–0.1)
Basophils Relative: 0.4 % (ref 0.0–3.0)
Eosinophils Absolute: 0.2 10*3/uL (ref 0.0–0.7)
Eosinophils Relative: 3.1 % (ref 0.0–5.0)
HCT: 46.8 % (ref 39.0–52.0)
Hemoglobin: 15.6 g/dL (ref 13.0–17.0)
Lymphocytes Relative: 30.8 % (ref 12.0–46.0)
Lymphs Abs: 2.2 10*3/uL (ref 0.7–4.0)
MCHC: 33.3 g/dL (ref 30.0–36.0)
MCV: 94 fl (ref 78.0–100.0)
Monocytes Absolute: 0.6 10*3/uL (ref 0.1–1.0)
Monocytes Relative: 8.8 % (ref 3.0–12.0)
Neutro Abs: 4.1 10*3/uL (ref 1.4–7.7)
Neutrophils Relative %: 56.9 % (ref 43.0–77.0)
Platelets: 231 10*3/uL (ref 150.0–400.0)
RBC: 4.98 Mil/uL (ref 4.22–5.81)
RDW: 13 % (ref 11.5–15.5)
WBC: 7.2 10*3/uL (ref 4.0–10.5)

## 2021-07-17 LAB — LIPID PANEL
Cholesterol: 144 mg/dL (ref 0–200)
HDL: 43.7 mg/dL (ref >39.00)
LDL Cholesterol: 75 mg/dL (ref 0–99)
NonHDL: 100.13
Total CHOL/HDL Ratio: 3
Triglycerides: 127 mg/dL (ref 0.0–149.0)
VLDL: 25.4 mg/dL (ref 0.0–40.0)

## 2021-07-17 LAB — COMPREHENSIVE METABOLIC PANEL
ALT: 16 U/L (ref 0–53)
AST: 15 U/L (ref 0–37)
Albumin: 4.5 g/dL (ref 3.5–5.2)
Alkaline Phosphatase: 80 U/L (ref 39–117)
BUN: 15 mg/dL (ref 6–23)
CO2: 32 mEq/L (ref 19–32)
Calcium: 9.6 mg/dL (ref 8.4–10.5)
Chloride: 103 mEq/L (ref 96–112)
Creatinine, Ser: 1.1 mg/dL (ref 0.40–1.50)
GFR: 71.7 mL/min (ref >60.00)
Glucose, Bld: 88 mg/dL (ref 70–99)
Potassium: 4.1 mEq/L (ref 3.5–5.1)
Sodium: 141 mEq/L (ref 135–145)
Total Bilirubin: 0.7 mg/dL (ref 0.2–1.2)
Total Protein: 7.1 g/dL (ref 6.0–8.3)

## 2021-07-17 LAB — PSA: PSA: 2.12 ng/mL (ref 0.10–4.00)

## 2021-07-17 LAB — TSH: TSH: 2.29 u[IU]/mL (ref 0.35–5.50)

## 2021-07-17 MED ORDER — LEVOCETIRIZINE DIHYDROCHLORIDE 5 MG PO TABS: 5.0000 mg | ORAL_TABLET | Freq: Every evening | ORAL | 1 refills | Status: DC

## 2021-07-17 MED ORDER — LISINOPRIL 20 MG PO TABS: 20.0000 mg | ORAL_TABLET | Freq: Every day | ORAL | 1 refills | Status: DC

## 2021-07-17 MED ORDER — SIMVASTATIN 80 MG PO TABS: 80.0000 mg | ORAL_TABLET | Freq: Every day | ORAL | 1 refills | Status: DC

## 2021-07-17 NOTE — Addendum Note (Signed)
Addended by: Emi Holes D on: 07/17/2021 08:53 AM   Modules accepted: Orders

## 2021-07-17 NOTE — Patient Instructions (Signed)

## 2021-07-17 NOTE — Progress Notes (Signed)
Office Note 07/17/2021  CC:  Chief Complaint  Patient presents with   Annual Exam    Pt is fasting    HPI:  Patient is a 61 y.o. male who is here for annual health maintenance exam and f/u HTN, HLD. A/P as of last visit: "1) HTN: controlled on lisinopril 20mg  qd. Lytes/cr today.   2) HLD: tolerating simvastatin 80mg  qd. FLP and hepatic panel today.   3) Health maintenance exam: Reviewed age and gender appropriate health maintenance issues (prudent diet, regular exercise, health risks of tobacco and excessive alcohol, use of seatbelts, fire alarms in home, use of sunscreen).  Also reviewed age and gender appropriate health screening as well as vaccine recommendations. Vaccines: flu given today.  Covid UTD.  Labs: cbc, cmet, flp, tsh, psa. Pt requests STD screening b/c he is single and sexually active---denies any current sx's of STD and denies past hx of STD. Prostate ca screening: PSA ordered. Colon ca screening: recall 2030."  INTERIM HX: All labs were normal last CPE. Jared Jimenez is feeling well. He admits his caloric intake is significantly higher than usual over the last several months.  Also less active due to living in an apartment and being bored.  No home blood pressure monitoring. Reports compliance with lisinopril and simvastatin.   Past Medical History:  Diagnosis Date   Allergy    BPH with obstruction/lower urinary tract symptoms    Dr. Louis Meckel 06/28/17--Flomax trial.   Diverticulosis of colon 05/2019   DYSLIPIDEMIA 08/25/2007   Family history of prostate cancer    Brother. Pt followed by Dr. Alinda Money.   Gustatory rhinitis    History of gross hematuria 1995   cystoscopy neg   HYPERTENSION 05/15/2009   Increased prostate specific antigen (PSA) velocity    06/2018: [facility]pt [facility] summer [facility] improved.  06/2019 stable.   Kidney stones 05/15/2009   Dr. Alinda Money.  ESWL 2010.   Palpitations 07/04/2009   RECTAL BLEEDING 08/25/2007    Past Surgical History:  Procedure  Laterality Date   COLONOSCOPY  04/08/07; 05/29/19   normal 2008 and 2020 (Dr. Dalene Carrow): recall 2030.   CYSTOSCOPY W/ URETEROSCOPY W/ LITHOTRIPSY  2010    Family History  Problem Relation Age of Onset   Prostate cancer Brother    Hyperlipidemia Brother    Hypertension Brother    Hyperlipidemia Mother    Hypertension Mother    Lung cancer Father        smoker   Hypertension Father    Clotting disorder Son    Hypertension Brother    Diabetes Neg Hx    Colon cancer Neg Hx    Colon polyps Neg Hx    Esophageal cancer Neg Hx    Stomach cancer Neg Hx    Rectal cancer Neg Hx     Social History   Socioeconomic History   Marital status: Divorced    Spouse name: Not on file   Number of children: Not on file   Years of education: Not on file   Highest education level: Not on file  Occupational History   Occupation: Music therapist  Tobacco Use   Smoking status: Never   Smokeless tobacco: Never  Vaping Use   Vaping Use: Never used  Substance and Sexual Activity   Alcohol use: Yes    Alcohol/week: 2.0 standard drinks    Types: 1 Glasses of wine, 1 Cans of beer per week    Comment: 1-2/week   Drug use: No   Sexual activity: Not  on file  Other Topics Concern   Not on file  Social History Narrative   Married, 2 sons.   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   No T/A/Ds.   Social Determinants of Health   Financial Resource Strain: Not on file  Food Insecurity: Not on file  Transportation Needs: Not on file  Physical Activity: Not on file  Stress: Not on file  Social Connections: Not on file  Intimate Partner Violence: Not on file    Outpatient Medications Prior to Visit  Medication Sig Dispense Refill   albuterol (VENTOLIN HFA) 108 (90 Base) MCG/ACT inhaler Inhale 2 puffs into the lungs every 4 (four) hours as needed for wheezing or shortness of breath. 1 each 0   sildenafil (REVATIO) 20 MG tablet TAKE 1 TO 5 TABLETS  BY  MOUTH DAILY AS NEEDED 30 TO 60 MINUTES PRIOR TO  INTERCOURSE 45 tablet 1   tamsulosin (FLOMAX) 0.4 MG CAPS capsule Take 1 capsule (0.4 mg total) by mouth daily. 30 capsule 3   benzonatate (TESSALON) 200 MG capsule Take 1 capsule (200 mg total) by mouth 3 (three) times daily as needed for cough. 20 capsule 1   dextromethorphan-guaiFENesin (MUCINEX DM) 30-600 MG 12hr tablet Take 1 tablet by mouth 2 (two) times daily. 20 tablet 0   levocetirizine (XYZAL) 5 MG tablet Take 1 tablet (5 mg total) by mouth every evening. 90 tablet 1   lisinopril (ZESTRIL) 20 MG tablet Take 1 tablet (20 mg total) by mouth daily. 90 tablet 1   predniSONE (DELTASONE) 20 MG tablet TAKE 2 TABLETS BY MOUTH EVERY DAY FOR 5 DAYS, THEN 1 TABLET BY MOUTH EVERY DAY FOR 5 DAYS 15 tablet 0   simvastatin (ZOCOR) 80 MG tablet Take 1 tablet (80 mg total) by mouth daily. 90 tablet 1   No facility-administered medications prior to visit.    Allergies  Allergen Reactions   Diphenhydramine Hcl     REACTION: Swelling in hans, rash    ROS Review of Systems  Constitutional:  Negative for appetite change, chills, fatigue and fever.  HENT:  Negative for congestion, dental problem, ear pain and sore throat.   Eyes:  Negative for discharge, redness and visual disturbance.  Respiratory:  Negative for cough, chest tightness, shortness of breath and wheezing.   Cardiovascular:  Negative for chest pain, palpitations and leg swelling.  Gastrointestinal:  Negative for abdominal pain, blood in stool, diarrhea, nausea and vomiting.  Genitourinary:  Negative for difficulty urinating, dysuria, flank pain, frequency, hematuria and urgency.  Musculoskeletal:  Negative for arthralgias, back pain, joint swelling, myalgias and neck stiffness.  Skin:  Negative for pallor and rash.  Neurological:  Negative for dizziness, speech difficulty, weakness and headaches.  Hematological:  Negative for adenopathy. Does not bruise/bleed easily.   Psychiatric/Behavioral:  Negative for confusion and sleep disturbance. The patient is not nervous/anxious.    PE; Vitals with BMI 07/17/2021 01/31/2021 12/03/2020  Height 6' 2.606" - -  Weight 215 lbs 13 oz 205 lbs 204 lbs  BMI 0000000 - -  Systolic 123XX123 - -  Diastolic 87 - -  Pulse 82 - -     Gen: Alert, well appearing.  Patient is oriented to person, place, time, and situation. AFFECT: pleasant, lucid thought and speech. ENT: Ears: EACs clear, normal epithelium.  TMs with good light reflex and landmarks bilaterally.  Eyes: no injection, icteris, swelling, or exudate.  EOMI, PERRLA. Nose: no drainage or turbinate edema/swelling.  No injection or focal lesion.  Mouth: lips without lesion/swelling.  Oral mucosa pink and moist.  Dentition intact and without obvious caries or gingival swelling.  Oropharynx without erythema, exudate, or swelling.  Neck: supple/nontender.  No LAD, mass, or TM.  Carotid pulses 2+ bilaterally, without bruits. CV: RRR, no m/r/g.   LUNGS: CTA bilat, nonlabored resps, good aeration in all lung fields. ABD: soft, NT, ND, BS normal.  No hepatospenomegaly or mass.  No bruits. EXT: no clubbing, cyanosis, or edema.  Musculoskeletal: no joint swelling, erythema, warmth, or tenderness.  ROM of all joints intact. Skin - no sores or suspicious lesions or rashes or color changes  Pertinent labs:  Lab Results  Component Value Date   TSH 1.70 07/16/2020   Lab Results  Component Value Date   WBC 6.8 07/16/2020   HGB 14.9 07/16/2020   HCT 45.0 07/16/2020   MCV 94.2 07/16/2020   PLT 213.0 07/16/2020   Lab Results  Component Value Date   CREATININE 1.19 07/16/2020   BUN 19 07/16/2020   NA 139 07/16/2020   K 4.1 07/16/2020   CL 105 07/16/2020   CO2 25 07/16/2020   Lab Results  Component Value Date   ALT 20 07/16/2020   AST 16 07/16/2020   ALKPHOS 84 07/16/2020   BILITOT 0.7 07/16/2020   Lab Results  Component Value Date   CHOL 147 07/16/2020   Lab Results   Component Value Date   HDL 39.30 07/16/2020   Lab Results  Component Value Date   LDLCALC 84 07/16/2020   Lab Results  Component Value Date   TRIG 119.0 07/16/2020   Lab Results  Component Value Date   CHOLHDL 4 07/16/2020   Lab Results  Component Value Date   PSA 1.89 07/16/2020   PSA 2.33 06/19/2019   PSA 2.33 06/19/2019   ASSESSMENT AND PLAN:   1) HTN:  Continue lisinopril 20 mg qd. Lytes/cr today.  2) HLD: Continue simvastatin 80 mg a day. Lipid panel and hepatic panel today.  3) Health maintenance exam: Reviewed age and gender appropriate health maintenance issues (prudent diet, regular exercise, health risks of tobacco and excessive alcohol, use of seatbelts, fire alarms in home, use of sunscreen).  Also reviewed age and gender appropriate health screening as well as vaccine recommendations. Vaccines: flu given.  Tdap->deferred.  Shingrix->deferred. Labs:  fasting HP + PSA.  Additionally, patient requests screening for sexually transmitted infections--check RPR, HIV, and urine GC/chlamydia. Prostate ca screening: PSA ordered. Colon ca screening: recall 2030.  An After Visit Summary was printed and given to the patient.  FOLLOW UP:  Return in about 6 months (around 01/14/2022) for routine chronic illness f/u.  Signed:  Crissie Sickles, MD           07/17/2021

## 2021-07-18 LAB — URINE CYTOLOGY ANCILLARY ONLY
Chlamydia: NEGATIVE
Comment: NEGATIVE
Comment: NORMAL
Neisseria Gonorrhea: NEGATIVE

## 2021-07-18 LAB — RPR: RPR Ser Ql: NONREACTIVE

## 2021-07-18 LAB — HIV ANTIBODY (ROUTINE TESTING W REFLEX): HIV 1&2 Ab, 4th Generation: NONREACTIVE

## 2021-08-12 ENCOUNTER — Other Ambulatory Visit: Payer: Self-pay

## 2021-08-12 MED ORDER — LEVOCETIRIZINE DIHYDROCHLORIDE 5 MG PO TABS
5.0000 mg | ORAL_TABLET | Freq: Every evening | ORAL | 1 refills | Status: DC
Start: 2021-08-12 — End: 2021-09-24

## 2021-08-12 MED ORDER — LISINOPRIL 20 MG PO TABS: 20.0000 mg | ORAL_TABLET | Freq: Every day | ORAL | 1 refills | Status: DC

## 2021-08-12 MED ORDER — SIMVASTATIN 80 MG PO TABS: 80.0000 mg | ORAL_TABLET | Freq: Every day | ORAL | 1 refills | Status: DC

## 2021-09-24 ENCOUNTER — Other Ambulatory Visit: Payer: Self-pay

## 2021-09-24 MED ORDER — SIMVASTATIN 80 MG PO TABS: 80.0000 mg | ORAL_TABLET | Freq: Every day | ORAL | 0 refills | Status: DC

## 2021-09-24 MED ORDER — LEVOCETIRIZINE DIHYDROCHLORIDE 5 MG PO TABS: 5.0000 mg | ORAL_TABLET | Freq: Every evening | ORAL | 0 refills | Status: DC

## 2021-09-24 MED ORDER — LISINOPRIL 20 MG PO TABS: 20.0000 mg | ORAL_TABLET | Freq: Every day | ORAL | 0 refills | Status: DC

## 2021-09-24 MED ORDER — TAMSULOSIN HCL 0.4 MG PO CAPS: 0.4000 mg | ORAL_CAPSULE | Freq: Every day | ORAL | 0 refills | Status: DC

## 2021-12-05 ENCOUNTER — Other Ambulatory Visit: Payer: Self-pay

## 2021-12-05 MED ORDER — TAMSULOSIN HCL 0.4 MG PO CAPS: 0.4000 mg | ORAL_CAPSULE | Freq: Every day | ORAL | 0 refills | Status: DC

## 2021-12-05 MED ORDER — LISINOPRIL 20 MG PO TABS: 20.0000 mg | ORAL_TABLET | Freq: Every day | ORAL | 0 refills | Status: DC

## 2021-12-05 MED ORDER — SIMVASTATIN 80 MG PO TABS: 80.0000 mg | ORAL_TABLET | Freq: Every day | ORAL | 0 refills | Status: DC

## 2021-12-05 MED ORDER — LEVOCETIRIZINE DIHYDROCHLORIDE 5 MG PO TABS
5.0000 mg | ORAL_TABLET | Freq: Every evening | ORAL | 0 refills | Status: DC
Start: 2021-12-05 — End: 2022-07-29

## 2021-12-26 ENCOUNTER — Ambulatory Visit (INDEPENDENT_AMBULATORY_CARE_PROVIDER_SITE_OTHER): Payer: BC Managed Care – PPO | Admitting: Family Medicine

## 2021-12-26 ENCOUNTER — Encounter: Payer: Self-pay | Admitting: Family Medicine

## 2021-12-26 VITALS — BP 117/78 | HR 89 | Temp 98.2°F | Ht 74.61 in | Wt 213.4 lb

## 2021-12-26 DIAGNOSIS — Z23 Encounter for immunization: Secondary | ICD-10-CM | POA: Diagnosis not present

## 2021-12-26 DIAGNOSIS — E7800 Pure hypercholesterolemia, unspecified: Secondary | ICD-10-CM

## 2021-12-26 DIAGNOSIS — N401 Enlarged prostate with lower urinary tract symptoms: Secondary | ICD-10-CM | POA: Diagnosis not present

## 2021-12-26 DIAGNOSIS — I10 Essential (primary) hypertension: Secondary | ICD-10-CM | POA: Diagnosis not present

## 2021-12-26 DIAGNOSIS — N138 Other obstructive and reflux uropathy: Secondary | ICD-10-CM

## 2021-12-26 LAB — COMPREHENSIVE METABOLIC PANEL
ALT: 17 U/L (ref 0–53)
AST: 17 U/L (ref 0–37)
Albumin: 4.8 g/dL (ref 3.5–5.2)
Alkaline Phosphatase: 90 U/L (ref 39–117)
BUN: 13 mg/dL (ref 6–23)
CO2: 25 mEq/L (ref 19–32)
Calcium: 9.5 mg/dL (ref 8.4–10.5)
Chloride: 104 mEq/L (ref 96–112)
Creatinine, Ser: 0.96 mg/dL (ref 0.40–1.50)
GFR: 84.16 mL/min (ref >60.00)
Glucose, Bld: 98 mg/dL (ref 70–99)
Potassium: 4.4 mEq/L (ref 3.5–5.1)
Sodium: 139 mEq/L (ref 135–145)
Total Bilirubin: 0.9 mg/dL (ref 0.2–1.2)
Total Protein: 7.1 g/dL (ref 6.0–8.3)

## 2021-12-26 LAB — LIPID PANEL
Cholesterol: 155 mg/dL (ref 0–200)
HDL: 38.5 mg/dL — ABNORMAL LOW (ref >39.00)
LDL Cholesterol: 93 mg/dL (ref 0–99)
NonHDL: 116.4
Total CHOL/HDL Ratio: 4
Triglycerides: 115 mg/dL (ref 0.0–149.0)
VLDL: 23 mg/dL (ref 0.0–40.0)

## 2021-12-26 MED ORDER — LISINOPRIL 20 MG PO TABS
20.0000 mg | ORAL_TABLET | Freq: Every day | ORAL | 1 refills | Status: DC
Start: 2021-12-26 — End: 2022-07-29

## 2021-12-26 MED ORDER — SIMVASTATIN 80 MG PO TABS: 80.0000 mg | ORAL_TABLET | Freq: Every day | ORAL | 1 refills | Status: DC

## 2021-12-26 MED ORDER — TAMSULOSIN HCL 0.4 MG PO CAPS
0.4000 mg | ORAL_CAPSULE | Freq: Every day | ORAL | 1 refills | Status: DC
Start: 2021-12-26 — End: 2022-07-29

## 2021-12-26 NOTE — Progress Notes (Signed)
OFFICE VISIT  12/26/2021  CC:  Chief Complaint  Patient presents with   Hypertension   Hyperlipidemia    Pt is fasting    Patient is a 61 y.o. male who presents for 52-month follow-up hypertension and hyperlipidemia. All was stable and labs were normal last visit.  INTERIM HX: Feeling well. He works in the yard vigorously for exercise.  Home blood pressures normal. Taking simvastatin no problem. Takes tamsulosin 0.4 mg nightly and finds this helpful for his BPH  ROS as above, plus--> no fevers, no CP, no SOB, no wheezing, no cough, no dizziness, no HAs, no rashes, no melena/hematochezia.  No polyuria or polydipsia.  No myalgias or arthralgias.  No focal weakness, paresthesias, or tremors.  No acute vision or hearing abnormalities.  No dysuria or unusual/new urinary urgency or frequency.  No recent changes in lower legs. No n/v/d or abd pain.  No palpitations.     Past Medical History:  Diagnosis Date   Allergy    BPH with obstruction/lower urinary tract symptoms    Dr. Marlou Porch 06/28/17--Flomax trial.   Diverticulosis of colon 05/2019   DYSLIPIDEMIA 08/25/2007   Family history of prostate cancer    Brother. Pt followed by Dr. Laverle Patter.   Gustatory rhinitis    History of gross hematuria 1995   cystoscopy neg   HYPERTENSION 05/15/2009   Increased prostate specific antigen (PSA) velocity    06/2018: Rpt at [facility] summer 2020 improved.  06/2019 stable.   Kidney stones 05/15/2009   Dr. Laverle Patter.  ESWL 2010.   Palpitations 07/04/2009   RECTAL BLEEDING 08/25/2007    Past Surgical History:  Procedure Laterality Date   COLONOSCOPY  04/08/07; 05/29/19   normal 2008 and 2020 (Dr. Drue Flirt): recall 2030.   CYSTOSCOPY W/ URETEROSCOPY W/ LITHOTRIPSY  2010    Outpatient Medications Prior to Visit  Medication Sig Dispense Refill   levocetirizine (XYZAL) 5 MG tablet Take 1 tablet (5 mg total) by mouth every evening. 90 tablet 0   sildenafil (REVATIO) 20 MG tablet TAKE 1 TO 5 TABLETS BY   MOUTH DAILY AS NEEDED 30 TO 60 MINUTES PRIOR TO  INTERCOURSE 45 tablet 1   albuterol (VENTOLIN HFA) 108 (90 Base) MCG/ACT inhaler Inhale 2 puffs into the lungs every 4 (four) hours as needed for wheezing or shortness of breath. (Patient not taking: Reported on 12/26/2021) 1 each 0   lisinopril (ZESTRIL) 20 MG tablet Take 1 tablet (20 mg total) by mouth daily. 90 tablet 0   simvastatin (ZOCOR) 80 MG tablet Take 1 tablet (80 mg total) by mouth daily. 90 tablet 0   tamsulosin (FLOMAX) 0.4 MG CAPS capsule Take 1 capsule (0.4 mg total) by mouth daily. 90 capsule 0   No facility-administered medications prior to visit.    Allergies  Allergen Reactions   Diphenhydramine Hcl     REACTION: Swelling in hans, rash    ROS As per HPI  PE:    12/26/2021    8:23 AM 07/17/2021    8:04 AM 01/31/2021   10:21 AM  Vitals with BMI  Height 6' 2.61" 6' 2.606"   Weight 213 lbs 6 oz 215 lbs 13 oz 205 lbs  BMI 26.96 27.26   Systolic 117 126   Diastolic 78 87   Pulse 89 82    Physical Exam  Gen: Alert, well appearing.  Patient is oriented to person, place, time, and situation. AFFECT: pleasant, lucid thought and speech.. CV: RRR, no m/r/g.   LUNGS: CTA bilat,  nonlabored resps, good aeration in all lung fields. EXT: no clubbing or cyanosis.  no edema.   LABS:  Last CBC Lab Results  Component Value Date   WBC 7.2 07/17/2021   HGB 15.6 07/17/2021   HCT 46.8 07/17/2021   MCV 94.0 07/17/2021   RDW 13.0 07/17/2021   PLT 231.0 07/17/2021   Last metabolic panel Lab Results  Component Value Date   GLUCOSE 88 07/17/2021   NA 141 07/17/2021   K 4.1 07/17/2021   CL 103 07/17/2021   CO2 32 07/17/2021   BUN 15 07/17/2021   CREATININE 1.10 07/17/2021   CALCIUM 9.6 07/17/2021   PROT 7.1 07/17/2021   ALBUMIN 4.5 07/17/2021   BILITOT 0.7 07/17/2021   ALKPHOS 80 07/17/2021   AST 15 07/17/2021   ALT 16 07/17/2021   Last lipids Lab Results  Component Value Date   CHOL 144 07/17/2021   HDL 43.70  07/17/2021   LDLCALC 75 07/17/2021   LDLDIRECT 163.6 01/18/2007   TRIG 127.0 07/17/2021   CHOLHDL 3 07/17/2021    Last thyroid functions Lab Results  Component Value Date   TSH 2.29 07/17/2021   IMPRESSION AND PLAN:  #1 essential hypertension, stable on lisinopril 20 mg a day.  His diet is low-salt. Electrolytes and creatinine today.  Hypercholesterolemia, doing well on Zocor 80 mg/day. Lipid panel and hepatic panel today.  3.  BPH: Good symptom control on tamsulosin 0.4 mg daily.  Refilled x90 days with 1 additional refill.  An After Visit Summary was printed and given to the patient.  FOLLOW UP: Return in about 6 months (around 06/28/2022) for annual CPE (fasting).  Signed:  Santiago Bumpers, MD           12/26/2021

## 2022-01-14 ENCOUNTER — Ambulatory Visit: Payer: BLUE CROSS/BLUE SHIELD | Admitting: Family Medicine

## 2022-01-26 ENCOUNTER — Ambulatory Visit: Payer: BLUE CROSS/BLUE SHIELD | Admitting: Family Medicine

## 2022-03-20 DIAGNOSIS — L821 Other seborrheic keratosis: Secondary | ICD-10-CM | POA: Diagnosis not present

## 2022-03-20 DIAGNOSIS — B078 Other viral warts: Secondary | ICD-10-CM | POA: Diagnosis not present

## 2022-03-20 DIAGNOSIS — L0889 Other specified local infections of the skin and subcutaneous tissue: Secondary | ICD-10-CM | POA: Diagnosis not present

## 2022-03-20 DIAGNOSIS — M71341 Other bursal cyst, right hand: Secondary | ICD-10-CM | POA: Diagnosis not present

## 2022-03-20 DIAGNOSIS — R238 Other skin changes: Secondary | ICD-10-CM | POA: Diagnosis not present

## 2022-03-20 DIAGNOSIS — M71342 Other bursal cyst, left hand: Secondary | ICD-10-CM | POA: Diagnosis not present

## 2022-03-20 DIAGNOSIS — L538 Other specified erythematous conditions: Secondary | ICD-10-CM | POA: Diagnosis not present

## 2022-03-20 DIAGNOSIS — D225 Melanocytic nevi of trunk: Secondary | ICD-10-CM | POA: Diagnosis not present

## 2022-06-23 ENCOUNTER — Other Ambulatory Visit: Payer: Self-pay | Admitting: Family Medicine

## 2022-07-21 ENCOUNTER — Encounter: Payer: BC Managed Care – PPO | Admitting: Family Medicine

## 2022-07-29 NOTE — Patient Instructions (Signed)
Health Maintenance, Male Adopting a healthy lifestyle and getting preventive care are important in promoting health and wellness. Ask your health care provider about: The right schedule for you to have regular tests and exams. Things you can do on your own to prevent diseases and keep yourself healthy. What should I know about diet, weight, and exercise? Eat a healthy diet  Eat a diet that includes plenty of vegetables, fruits, low-fat dairy products, and lean protein. Do not eat a lot of foods that are high in solid fats, added sugars, or sodium. Maintain a healthy weight Body mass index (BMI) is a measurement that can be used to identify possible weight problems. It estimates body fat based on height and weight. Your health care provider can help determine your BMI and help you achieve or maintain a healthy weight. Get regular exercise Get regular exercise. This is one of the most important things you can do for your health. Most adults should: Exercise for at least 150 minutes each week. The exercise should increase your heart rate and make you sweat (moderate-intensity exercise). Do strengthening exercises at least twice a week. This is in addition to the moderate-intensity exercise. Spend less time sitting. Even light physical activity can be beneficial. Watch cholesterol and blood lipids Have your blood tested for lipids and cholesterol at 64 years of age, then have this test every 5 years. You may need to have your cholesterol levels checked more often if: Your lipid or cholesterol levels are high. You are older than 64 years of age. You are at high risk for heart disease. What should I know about cancer screening? Many types of cancers can be detected early and may often be prevented. Depending on your health history and family history, you may need to have cancer screening at various ages. This may include screening for: Colorectal cancer. Prostate cancer. Skin cancer. Lung  cancer. What should I know about heart disease, diabetes, and high blood pressure? Blood pressure and heart disease High blood pressure causes heart disease and increases the risk of stroke. This is more likely to develop in people who have high blood pressure readings or are overweight. Talk with your health care provider about your target blood pressure readings. Have your blood pressure checked: Every 3-5 years if you are 18-39 years of age. Every year if you are 40 years old or older. If you are between the ages of 65 and 75 and are a current or former smoker, ask your health care provider if you should have a one-time screening for abdominal aortic aneurysm (AAA). Diabetes Have regular diabetes screenings. This checks your fasting blood sugar level. Have the screening done: Once every three years after age 45 if you are at a normal weight and have a low risk for diabetes. More often and at a younger age if you are overweight or have a high risk for diabetes. What should I know about preventing infection? Hepatitis B If you have a higher risk for hepatitis B, you should be screened for this virus. Talk with your health care provider to find out if you are at risk for hepatitis B infection. Hepatitis C Blood testing is recommended for: Everyone born from 1945 through 1965. Anyone with known risk factors for hepatitis C. Sexually transmitted infections (STIs) You should be screened each year for STIs, including gonorrhea and chlamydia, if: You are sexually active and are younger than 64 years of age. You are older than 64 years of age and your   health care provider tells you that you are at risk for this type of infection. Your sexual activity has changed since you were last screened, and you are at increased risk for chlamydia or gonorrhea. Ask your health care provider if you are at risk. Ask your health care provider about whether you are at high risk for HIV. Your health care provider  may recommend a prescription medicine to help prevent HIV infection. If you choose to take medicine to prevent HIV, you should first get tested for HIV. You should then be tested every 3 months for as long as you are taking the medicine. Follow these instructions at home: Alcohol use Do not drink alcohol if your health care provider tells you not to drink. If you drink alcohol: Limit how much you have to 0-2 drinks a day. Know how much alcohol is in your drink. In the [place] one drink equals one 12 oz bottle of beer (355 mL), one 5 oz glass of wine (148 mL), or one 1 oz glass of hard liquor (44 mL). Lifestyle Do not use any products that contain nicotine or tobacco. These products include cigarettes, chewing tobacco, and vaping devices, such as e-cigarettes. If you need help quitting, ask your health care provider. Do not use street drugs. Do not share needles. Ask your health care provider for help if you need support or information about quitting drugs. General instructions Schedule regular health, dental, and eye exams. Stay current with your vaccines. Tell your health care provider if: You often feel depressed. You have ever been abused or do not feel safe at home. Summary Adopting a healthy lifestyle and getting preventive care are important in promoting health and wellness. Follow your health care provider's instructions about healthy diet, exercising, and getting tested or screened for diseases. Follow your health care provider's instructions on monitoring your cholesterol and blood pressure. This information is not intended to replace advice given to you by your health care provider. Make sure you discuss any questions you have with your health care provider. Document Revised: 10/14/2020 Document Reviewed: 10/14/2020 Elsevier Patient Education  2023 Elsevier Inc.  

## 2022-07-31 ENCOUNTER — Ambulatory Visit (INDEPENDENT_AMBULATORY_CARE_PROVIDER_SITE_OTHER): Payer: 59 | Admitting: Family Medicine

## 2022-07-31 ENCOUNTER — Other Ambulatory Visit (HOSPITAL_COMMUNITY)
Admission: RE | Admit: 2022-07-31 | Discharge: 2022-07-31 | Disposition: A | Discharge status: Home / Self Care | Payer: Managed Care, Other (non HMO) | Source: Ambulatory Visit | Attending: Family Medicine | Admitting: Family Medicine

## 2022-07-31 ENCOUNTER — Encounter: Payer: Self-pay | Admitting: Family Medicine

## 2022-07-31 VITALS — BP 129/82 | HR 76 | Temp 97.6°F | Ht 73.75 in | Wt 219.0 lb

## 2022-07-31 DIAGNOSIS — F5101 Primary insomnia: Secondary | ICD-10-CM | POA: Diagnosis not present

## 2022-07-31 DIAGNOSIS — Z23 Encounter for immunization: Secondary | ICD-10-CM

## 2022-07-31 DIAGNOSIS — Z125 Encounter for screening for malignant neoplasm of prostate: Secondary | ICD-10-CM

## 2022-07-31 DIAGNOSIS — Z113 Encounter for screening for infections with a predominantly sexual mode of transmission: Secondary | ICD-10-CM | POA: Diagnosis not present

## 2022-07-31 DIAGNOSIS — Z0000 Encounter for general adult medical examination without abnormal findings: Secondary | ICD-10-CM

## 2022-07-31 DIAGNOSIS — I10 Essential (primary) hypertension: Secondary | ICD-10-CM | POA: Diagnosis not present

## 2022-07-31 DIAGNOSIS — E7800 Pure hypercholesterolemia, unspecified: Secondary | ICD-10-CM

## 2022-07-31 LAB — CBC
HCT: 46.8 % (ref 39.0–52.0)
Hemoglobin: 16.1 g/dL (ref 13.0–17.0)
MCHC: 34.3 g/dL (ref 30.0–36.0)
MCV: 92.6 fl (ref 78.0–100.0)
Platelets: 227 K/uL (ref 150.0–400.0)
RBC: 5.06 Mil/uL (ref 4.22–5.81)
RDW: 13.1 % (ref 11.5–15.5)
WBC: 7.1 K/uL (ref 4.0–10.5)

## 2022-07-31 LAB — LIPID PANEL
Cholesterol: 172 mg/dL (ref 0–200)
HDL: 39.5 mg/dL (ref >39.00)
LDL Cholesterol: 102 mg/dL — ABNORMAL HIGH (ref 0–99)
NonHDL: 132.43
Total CHOL/HDL Ratio: 4
Triglycerides: 154 mg/dL — ABNORMAL HIGH (ref 0.0–149.0)
VLDL: 30.8 mg/dL (ref 0.0–40.0)

## 2022-07-31 LAB — COMPREHENSIVE METABOLIC PANEL WITH GFR
ALT: 20 U/L (ref 0–53)
AST: 17 U/L (ref 0–37)
Albumin: 4.7 g/dL (ref 3.5–5.2)
Alkaline Phosphatase: 93 U/L (ref 39–117)
BUN: 14 mg/dL (ref 6–23)
CO2: 26 meq/L (ref 19–32)
Calcium: 9.5 mg/dL (ref 8.4–10.5)
Chloride: 104 meq/L (ref 96–112)
Creatinine, Ser: 1.12 mg/dL (ref 0.40–1.50)
GFR: 69.65 mL/min
Glucose, Bld: 95 mg/dL (ref 70–99)
Potassium: 4.3 meq/L (ref 3.5–5.1)
Sodium: 140 meq/L (ref 135–145)
Total Bilirubin: 0.7 mg/dL (ref 0.2–1.2)
Total Protein: 7.2 g/dL (ref 6.0–8.3)

## 2022-07-31 LAB — PSA: PSA: 2.33 ng/mL (ref 0.10–4.00)

## 2022-07-31 MED ORDER — SILDENAFIL CITRATE 20 MG PO TABS: ORAL_TABLET | ORAL | 1 refills | Status: DC

## 2022-07-31 MED ORDER — LISINOPRIL 20 MG PO TABS: 20.0000 mg | ORAL_TABLET | Freq: Every day | ORAL | 1 refills | Status: DC

## 2022-07-31 MED ORDER — SIMVASTATIN 80 MG PO TABS: 80.0000 mg | ORAL_TABLET | Freq: Every day | ORAL | 1 refills | Status: DC

## 2022-07-31 MED ORDER — TAMSULOSIN HCL 0.4 MG PO CAPS: 0.4000 mg | ORAL_CAPSULE | Freq: Every day | ORAL | 1 refills | Status: DC

## 2022-07-31 MED ORDER — LEVOCETIRIZINE DIHYDROCHLORIDE 5 MG PO TABS: 5.0000 mg | ORAL_TABLET | Freq: Every evening | ORAL | 1 refills | Status: DC

## 2022-07-31 MED ORDER — TRAZODONE HCL 50 MG PO TABS: ORAL_TABLET | ORAL | 1 refills | Status: DC

## 2022-07-31 NOTE — Progress Notes (Signed)
Office Note 07/31/2022  CC:  Chief Complaint  Patient presents with   Annual Exam    Pt is fasting    HPI:  Patient is a 62 y.o. male who is here for annual health maintenance exam and f/u HTN and HLD. Jared Jimenez is feeling well.  He plans on retiring in 1 year.   No home blood pressure monitoring.  No problems with lisinopril or simvastatin.  He requests STD screening tests.  Asymptomatic.  Last few months has been having problems with waking up every 2 hours at night and sometimes takes 30 minutes to get back to sleep.  He initiates sleep without problem.  Nocturia is not necessarily waking him up.  He is not depressed.  Denies any recent change in his sleep hygiene.  Past Medical History:  Diagnosis Date   Allergy    BPH with obstruction/lower urinary tract symptoms    Dr. Louis Meckel 06/28/17--Flomax trial.   Diverticulosis of colon 05/2019   DYSLIPIDEMIA 08/25/2007   Family history of prostate cancer    Brother. Pt followed by Dr. Alinda Money.   Gustatory rhinitis    History of gross hematuria 1995   cystoscopy neg   HYPERTENSION 05/15/2009   Increased prostate specific antigen (PSA) velocity    06/2018: [facility] improved.  06/2019 stable.   Kidney stones 05/15/2009   Dr. Alinda Money.  ESWL 2010.   Palpitations 07/04/2009   RECTAL BLEEDING 08/25/2007    Past Surgical History:  Procedure Laterality Date   COLONOSCOPY  04/08/07; 05/29/19   normal 2008 and 2020 (Dr. Dalene Carrow): recall 2030.   CYSTOSCOPY W/ URETEROSCOPY W/ LITHOTRIPSY  2010    Family History  Problem Relation Age of Onset   Prostate cancer Brother    Hyperlipidemia Brother    Hypertension Brother    Hyperlipidemia Mother    Hypertension Mother    Lung cancer Father        smoker   Hypertension Father    Clotting disorder Son    Hypertension Brother    Diabetes Neg Hx    Colon cancer Neg Hx    Colon polyps Neg Hx    Esophageal cancer Neg Hx    Stomach cancer Neg Hx    Rectal cancer Neg Hx      Social History   Socioeconomic History   Marital status: Divorced    Spouse name: Not on file   Number of children: Not on file   Years of education: Not on file   Highest education level: Not on file  Occupational History   Occupation: Music therapist  Tobacco Use   Smoking status: Never   Smokeless tobacco: Never  Vaping Use   Vaping Use: Never used  Substance and Sexual Activity   Alcohol use: Yes    Alcohol/week: 2.0 standard drinks of alcohol    Types: 1 Glasses of wine, 1 Cans of beer per week    Comment: 1-2/week   Drug use: No   Sexual activity: Not on file  Other Topics Concern   Not on file  Social History Narrative   Married, 2 sons.   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   No T/A/Ds.   Social Determinants of Health   Financial Resource Strain: Not on file  Food Insecurity: Not on file  Transportation Needs: Not on file  Physical Activity: Not on file  Stress: Not on file  Social Connections: Not on file  Intimate Partner Violence:  Not on file    Outpatient Medications Prior to Visit  Medication Sig Dispense Refill   levocetirizine (XYZAL) 5 MG tablet Take 1 tablet (5 mg total) by mouth every evening. 90 tablet 0   lisinopril (ZESTRIL) 20 MG tablet Take 1 tablet (20 mg total) by mouth daily. 90 tablet 1   sildenafil (REVATIO) 20 MG tablet TAKE 1 TO 5 TABLETS BY  MOUTH DAILY AS NEEDED 30 TO 60 MINUTES PRIOR TO  INTERCOURSE 45 tablet 1   simvastatin (ZOCOR) 80 MG tablet Take 1 tablet (80 mg total) by mouth daily. 90 tablet 1   tamsulosin (FLOMAX) 0.4 MG CAPS capsule Take 1 capsule (0.4 mg total) by mouth daily. 90 capsule 1   No facility-administered medications prior to visit.    Allergies  Allergen Reactions   Diphenhydramine Hcl     REACTION: Swelling in hans, rash    Review of Systems  Constitutional:  Negative for appetite change, chills, fatigue and fever.  HENT:  Negative for congestion,  dental problem, ear pain and sore throat.   Eyes:  Negative for discharge, redness and visual disturbance.  Respiratory:  Negative for cough, chest tightness, shortness of breath and wheezing.   Cardiovascular:  Negative for chest pain, palpitations and leg swelling.  Gastrointestinal:  Negative for abdominal pain, blood in stool, diarrhea, nausea and vomiting.  Genitourinary:  Negative for difficulty urinating, dysuria, flank pain, frequency, hematuria and urgency.  Musculoskeletal:  Negative for arthralgias, back pain, joint swelling, myalgias and neck stiffness.  Skin:  Negative for pallor and rash.  Neurological:  Negative for dizziness, speech difficulty, weakness and headaches.  Hematological:  Negative for adenopathy. Does not bruise/bleed easily.  Psychiatric/Behavioral:  Positive for sleep disturbance. Negative for confusion. The patient is not nervous/anxious.     PE;    07/31/2022    7:56 AM 12/26/2021    8:23 AM 07/17/2021    8:04 AM  Vitals with BMI  Height 6' 1.75" 6' 2.61" 6' 2.606"  Weight 219 lbs 213 lbs 6 oz 215 lbs 13 oz  BMI 28.32 [id number]  Systolic Q000111Q [id number]  Diastolic 82 78 87  Pulse 76 89 82   Gen: Alert, well appearing.  Patient is oriented to person, place, time, and situation. AFFECT: pleasant, lucid thought and speech. ENT: Ears: EACs clear, normal epithelium.  TMs with good light reflex and landmarks bilaterally.  Eyes: no injection, icteris, swelling, or exudate.  EOMI, PERRLA. Nose: no drainage or turbinate edema/swelling.  No injection or focal lesion.  Mouth: lips without lesion/swelling.  Oral mucosa pink and moist.  Dentition intact and without obvious caries or gingival swelling.  Oropharynx without erythema, exudate, or swelling.  Neck: supple/nontender.  No LAD, mass, or TM.  Carotid pulses 2+ bilaterally, without bruits. CV: RRR, no m/r/g.   LUNGS: CTA bilat, nonlabored resps, good aeration in all lung fields. ABD: soft, NT, ND, BS normal.  No  hepatospenomegaly or mass.  No bruits. EXT: no clubbing, cyanosis, or edema.  Musculoskeletal: no joint swelling, erythema, warmth, or tenderness.  ROM of all joints intact. Skin - no sores or suspicious lesions or rashes or color changes  Pertinent labs:  Lab Results  Component Value Date   TSH 2.29 07/17/2021   Lab Results  Component Value Date   WBC 7.2 07/17/2021   HGB 15.6 07/17/2021   HCT 46.8 07/17/2021   MCV 94.0 07/17/2021   PLT 231.0 07/17/2021   Lab Results  Component Value Date  CREATININE 0.96 12/26/2021   BUN 13 12/26/2021   NA 139 12/26/2021   K 4.4 12/26/2021   CL 104 12/26/2021   CO2 25 12/26/2021   Lab Results  Component Value Date   ALT 17 12/26/2021   AST 17 12/26/2021   ALKPHOS 90 12/26/2021   BILITOT 0.9 12/26/2021   Lab Results  Component Value Date   CHOL 155 12/26/2021   Lab Results  Component Value Date   HDL 38.50 (L) 12/26/2021   Lab Results  Component Value Date   LDLCALC 93 12/26/2021   Lab Results  Component Value Date   TRIG 115.0 12/26/2021   Lab Results  Component Value Date   CHOLHDL 4 12/26/2021   Lab Results  Component Value Date   PSA 2.12 07/17/2021   PSA 1.89 07/16/2020   PSA 2.33 06/19/2019   ASSESSMENT AND PLAN:   #1 health maintenance exam: Reviewed age and gender appropriate health maintenance issues (prudent diet, regular exercise, health risks of tobacco and excessive alcohol, use of seatbelts, fire alarms in home, use of sunscreen).  Also reviewed age and gender appropriate health screening as well as vaccine recommendations. Vaccines:  Tdap->deferred.  Shingrix->#2 today. Labs:  fasting HP + PSA.  Additionally, patient requests screening for sexually transmitted infections--check RPR, HIV, and urine GC/chlamydia. Prostate ca screening: PSA ordered. Colon ca screening: recall 2030  #2 insomnia. Trial of trazodone 50 mg, 1-2 nightly as needed, #30, refill x 1.  #3 hypertension, well-controlled on  lisinopril 20 mg a day. Electrolytes and creatinine today  4.  Hypercholesterolemia, doing well on Zocor 80 mg a day. Lipid panel and hepatic panel today.  An After Visit Summary was printed and given to the patient.  FOLLOW UP:  Return in about 6 months (around 01/29/2023) for routine chronic illness f/u.  Signed:  Crissie Sickles, MD           07/31/2022

## 2022-08-03 ENCOUNTER — Encounter: Payer: Self-pay | Admitting: Family Medicine

## 2022-08-03 LAB — URINE CYTOLOGY ANCILLARY ONLY
Chlamydia: NEGATIVE
Comment: NEGATIVE
Comment: NORMAL
Neisseria Gonorrhea: NEGATIVE

## 2022-08-03 LAB — RPR: RPR Ser Ql: NONREACTIVE

## 2022-08-03 LAB — HIV ANTIBODY (ROUTINE TESTING W REFLEX): HIV 1&2 Ab, 4th Generation: NONREACTIVE

## 2022-08-03 MED ORDER — SILDENAFIL CITRATE 20 MG PO TABS: ORAL_TABLET | ORAL | 1 refills | Status: DC

## 2022-08-28 ENCOUNTER — Other Ambulatory Visit (HOSPITAL_COMMUNITY): Payer: Self-pay

## 2023-01-04 ENCOUNTER — Other Ambulatory Visit: Payer: Self-pay | Admitting: Family Medicine

## 2023-01-06 ENCOUNTER — Encounter (INDEPENDENT_AMBULATORY_CARE_PROVIDER_SITE_OTHER): Payer: Self-pay

## 2023-01-26 NOTE — Patient Instructions (Signed)

## 2023-01-29 ENCOUNTER — Ambulatory Visit: Payer: Managed Care, Other (non HMO) | Admitting: Family Medicine

## 2023-01-29 ENCOUNTER — Encounter: Payer: Self-pay | Admitting: Family Medicine

## 2023-01-29 VITALS — BP 125/81 | HR 96 | Temp 97.6°F | Ht 73.75 in | Wt 219.0 lb

## 2023-01-29 DIAGNOSIS — I10 Essential (primary) hypertension: Secondary | ICD-10-CM

## 2023-01-29 DIAGNOSIS — F5104 Psychophysiologic insomnia: Secondary | ICD-10-CM | POA: Diagnosis not present

## 2023-01-29 DIAGNOSIS — E7800 Pure hypercholesterolemia, unspecified: Secondary | ICD-10-CM | POA: Diagnosis not present

## 2023-01-29 LAB — LIPID PANEL
Cholesterol: 166 mg/dL (ref 0–200)
HDL: 36.5 mg/dL — ABNORMAL LOW (ref >39.00)
LDL Cholesterol: 100 mg/dL — ABNORMAL HIGH (ref 0–99)
NonHDL: 129.38
Total CHOL/HDL Ratio: 5
Triglycerides: 147 mg/dL (ref 0.0–149.0)
VLDL: 29.4 mg/dL (ref 0.0–40.0)

## 2023-01-29 LAB — BASIC METABOLIC PANEL
BUN: 15 mg/dL (ref 6–23)
CO2: 26 mEq/L (ref 19–32)
Calcium: 9 mg/dL (ref 8.4–10.5)
Chloride: 104 mEq/L (ref 96–112)
Creatinine, Ser: 1.05 mg/dL (ref 0.40–1.50)
GFR: 75 mL/min (ref >60.00)
Glucose, Bld: 92 mg/dL (ref 70–99)
Potassium: 4 mEq/L (ref 3.5–5.1)
Sodium: 140 mEq/L (ref 135–145)

## 2023-01-29 MED ORDER — SIMVASTATIN 80 MG PO TABS: 80.0000 mg | ORAL_TABLET | Freq: Every day | ORAL | 1 refills | Status: DC

## 2023-01-29 MED ORDER — LEVOCETIRIZINE DIHYDROCHLORIDE 5 MG PO TABS: 5.0000 mg | ORAL_TABLET | Freq: Every evening | ORAL | 1 refills | Status: DC

## 2023-01-29 MED ORDER — LISINOPRIL 20 MG PO TABS: 20.0000 mg | ORAL_TABLET | Freq: Every day | ORAL | 1 refills | Status: DC

## 2023-01-29 MED ORDER — TAMSULOSIN HCL 0.4 MG PO CAPS: 0.4000 mg | ORAL_CAPSULE | Freq: Every day | ORAL | 1 refills | Status: DC

## 2023-01-29 MED ORDER — ZOLPIDEM TARTRATE 10 MG PO TABS: 10.0000 mg | ORAL_TABLET | Freq: Every evening | ORAL | 3 refills | Status: DC | PRN

## 2023-01-29 NOTE — Progress Notes (Signed)
OFFICE VISIT  01/29/2023  CC:  Chief Complaint  Patient presents with   Medical Management of Chronic Issues    Pt is fasting    Patient is a 62 y.o. male who presents for 30-month follow-up hypertension, hyperlipidemia, and insomnia. A/P as of last visit: "#1 insomnia. Trial of trazodone 50 mg, 1-2 nightly as needed, #30, refill x 1.   #2 hypertension, well-controlled on lisinopril 20 mg a day. Electrolytes and creatinine today   3.  Hypercholesterolemia, doing well on Zocor 80 mg a day. Lipid panel and hepatic panel today."  INTERIM HX: Jared Jimenez feels well. 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  He is doing a little bit of pull-ups for exercise. No home blood pressure monitoring.  For his maintenance insomnia, the trazodone made him feel fuzzy and slow cognition in the morning.  He only tried it once. He has never used any other prescription sleep aid.  He is allergic to Benadryl.  ROS as above, plus--> no fevers, no CP, no SOB, no wheezing, no cough, no dizziness, no HAs, no rashes, no melena/hematochezia.  No polyuria or polydipsia.  No myalgias or arthralgias.  No focal weakness, paresthesias, or tremors.  No acute vision or hearing abnormalities.  No dysuria or unusual/new urinary urgency or frequency.  No recent changes in lower legs. No n/v/d or abd pain.  No palpitations.     Past Medical History:  Diagnosis Date   Allergy    BPH with obstruction/lower urinary tract symptoms    Dr. Marlou Porch 06/28/17--Flomax trial.   Diverticulosis of colon 05/2019   DYSLIPIDEMIA 08/25/2007   Family history of prostate cancer    Brother. Pt followed by Dr. Laverle Patter.   Gustatory rhinitis    History of gross hematuria 1995   cystoscopy neg   HYPERTENSION 05/15/2009   Increased prostate specific antigen (PSA) velocity    06/2018: Rpt at [facility] summer 2020 improved.  06/2019 stable.   Kidney stones 05/15/2009   Dr. Laverle Patter.  ESWL 2010.   Palpitations 07/04/2009   RECTAL  BLEEDING 08/25/2007    Past Surgical History:  Procedure Laterality Date   COLONOSCOPY  04/08/07; 05/29/19   normal 2008 and 2020 (Dr. Drue Flirt): recall 2030.   CYSTOSCOPY W/ URETEROSCOPY W/ LITHOTRIPSY  2010    Outpatient Medications Prior to Visit  Medication Sig Dispense Refill   sildenafil (REVATIO) 20 MG tablet TAKE 1 TO 5 TABLETS BY  MOUTH DAILY AS NEEDED 30 TO 60 MINUTES PRIOR TO  INTERCOURSE 20 tablet 1   traZODone (DESYREL) 50 MG tablet 1-2 tabs po qhs prn insomnia 30 tablet 1   levocetirizine (XYZAL) 5 MG tablet Take 1 tablet (5 mg total) by mouth every evening. 90 tablet 1   lisinopril (ZESTRIL) 20 MG tablet Take 1 tablet (20 mg total) by mouth daily. 90 tablet 1   simvastatin (ZOCOR) 80 MG tablet Take 1 tablet (80 mg total) by mouth daily. 90 tablet 1   tamsulosin (FLOMAX) 0.4 MG CAPS capsule Take 1 capsule (0.4 mg total) by mouth daily. 90 capsule 1   No facility-administered medications prior to visit.    Allergies  Allergen Reactions   Diphenhydramine Hcl     REACTION: Swelling in hans, rash    Review of Systems As per HPI  PE:    01/29/2023    8:07 AM 07/31/2022    7:56 AM 12/26/2021    8:23 AM  Vitals with BMI  Height 6' 1.75" 6' 1.75" 6' 2.61"  Weight 219 lbs 219 lbs 213 lbs 6 oz  BMI 28.32 28.32 26.96  Systolic 125 129 540  Diastolic 81 82 78  Pulse 96 76 89     Physical Exam  Gen: Alert, well appearing.  Patient is oriented to person, place, time, and situation. AFFECT: pleasant, lucid thought and speech. CV: RRR, no m/r/g.   LUNGS: CTA bilat, nonlabored resps, good aeration in all lung fields. EXT: no clubbing or cyanosis.  no edema.    LABS:  Last CBC Lab Results  Component Value Date   WBC 7.1 07/31/2022   HGB 16.1 07/31/2022   HCT 46.8 07/31/2022   MCV 92.6 07/31/2022   RDW 13.1 07/31/2022   PLT 227.0 07/31/2022   Last metabolic panel Lab Results  Component Value Date   GLUCOSE 95 07/31/2022   NA 140 07/31/2022   K 4.3  07/31/2022   CL 104 07/31/2022   CO2 26 07/31/2022   BUN 14 07/31/2022   CREATININE 1.12 07/31/2022   GFR 69.65 07/31/2022   CALCIUM 9.5 07/31/2022   PROT 7.2 07/31/2022   ALBUMIN 4.7 07/31/2022   BILITOT 0.7 07/31/2022   ALKPHOS 93 07/31/2022   AST 17 07/31/2022   ALT 20 07/31/2022   Last lipids Lab Results  Component Value Date   CHOL 172 07/31/2022   HDL 39.50 07/31/2022   LDLCALC 102 (H) 07/31/2022   LDLDIRECT 163.6 01/18/2007   TRIG 154.0 (H) 07/31/2022   CHOLHDL 4 07/31/2022    Last thyroid functions Lab Results  Component Value Date   TSH 2.29 07/17/2021    IMPRESSION AND PLAN:  #1 hypertension, well-controlled on lisinopril 20 mg a day. Electrolytes and creatinine monitoring today.  2.  Hypercholesterolemia, doing well on simvastatin 80 mg a day. Lipid panel today.  #3 maintenance insomnia. Trazodone caused hangover effect. Will do trial of Ambien 10 mg nightly as needed, #15, 2 refills. Therapeutic expectations and side effect profile of medication discussed today.  Patient's questions answered.  An After Visit Summary was printed and given to the patient.  FOLLOW UP: Return in about 6 months (around 08/01/2023) for annual CPE (fasting).  Signed:  Santiago Bumpers, MD           01/29/2023

## 2023-05-10 ENCOUNTER — Telehealth: Payer: Self-pay | Admitting: Family Medicine

## 2023-05-10 NOTE — Telephone Encounter (Signed)
Patient has started a new position and needs his prescriptions sent to [facility]. Phone number is [phone]. He is also requesting refill tamsulosin (FLOMAX) 0.4 MG CAPS capsule sent to [facility] in [place], [place]. Since he is running low and needs that sooner than the other medications.

## 2023-05-12 NOTE — Telephone Encounter (Signed)
Patient has called again to inquire about his medication being switched to a new  mail order pharmacy. Please review previous message for full details.

## 2023-05-13 MED ORDER — TAMSULOSIN HCL 0.4 MG PO CAPS: 0.4000 mg | ORAL_CAPSULE | Freq: Every day | ORAL | 0 refills | Status: DC

## 2023-05-13 NOTE — Telephone Encounter (Signed)
Rx sent 

## 2023-05-28 ENCOUNTER — Other Ambulatory Visit: Payer: Self-pay

## 2023-05-28 MED ORDER — TAMSULOSIN HCL 0.4 MG PO CAPS: 0.4000 mg | ORAL_CAPSULE | Freq: Every day | ORAL | 0 refills | Status: DC

## 2023-05-28 MED ORDER — LEVOCETIRIZINE DIHYDROCHLORIDE 5 MG PO TABS: 5.0000 mg | ORAL_TABLET | Freq: Every evening | ORAL | 0 refills | Status: DC

## 2023-05-28 MED ORDER — SILDENAFIL CITRATE 20 MG PO TABS: ORAL_TABLET | ORAL | 1 refills | Status: DC

## 2023-05-28 MED ORDER — SIMVASTATIN 80 MG PO TABS: 80.0000 mg | ORAL_TABLET | Freq: Every day | ORAL | 0 refills | Status: DC

## 2023-05-28 MED ORDER — LISINOPRIL 20 MG PO TABS: 20.0000 mg | ORAL_TABLET | Freq: Every day | ORAL | 0 refills | Status: DC

## 2023-06-03 ENCOUNTER — Other Ambulatory Visit: Payer: Self-pay | Admitting: Family Medicine

## 2023-08-06 ENCOUNTER — Encounter: Payer: Managed Care, Other (non HMO) | Admitting: Family Medicine

## 2023-08-08 ENCOUNTER — Other Ambulatory Visit: Payer: Self-pay | Admitting: Family Medicine

## 2023-08-09 NOTE — Patient Instructions (Signed)

## 2023-08-09 NOTE — Telephone Encounter (Signed)
 Pt has upcoming CPE appt on 3/5

## 2023-08-11 ENCOUNTER — Encounter: Payer: Self-pay | Admitting: Family Medicine

## 2023-08-11 ENCOUNTER — Other Ambulatory Visit (HOSPITAL_COMMUNITY)
Admission: RE | Admit: 2023-08-11 | Discharge: 2023-08-11 | Disposition: A | Discharge status: Home / Self Care | Source: Ambulatory Visit | Attending: Family Medicine | Admitting: Family Medicine

## 2023-08-11 ENCOUNTER — Ambulatory Visit (INDEPENDENT_AMBULATORY_CARE_PROVIDER_SITE_OTHER): Payer: Managed Care, Other (non HMO) | Admitting: Family Medicine

## 2023-08-11 ENCOUNTER — Telehealth: Payer: Self-pay | Admitting: Family Medicine

## 2023-08-11 ENCOUNTER — Other Ambulatory Visit: Payer: Self-pay

## 2023-08-11 VITALS — BP 127/82 | HR 81 | Temp 97.9°F | Wt 212.6 lb

## 2023-08-11 DIAGNOSIS — Z113 Encounter for screening for infections with a predominantly sexual mode of transmission: Secondary | ICD-10-CM | POA: Diagnosis not present

## 2023-08-11 DIAGNOSIS — J209 Acute bronchitis, unspecified: Secondary | ICD-10-CM

## 2023-08-11 DIAGNOSIS — Z0000 Encounter for general adult medical examination without abnormal findings: Secondary | ICD-10-CM | POA: Diagnosis not present

## 2023-08-11 DIAGNOSIS — Z125 Encounter for screening for malignant neoplasm of prostate: Secondary | ICD-10-CM | POA: Diagnosis not present

## 2023-08-11 DIAGNOSIS — E7800 Pure hypercholesterolemia, unspecified: Secondary | ICD-10-CM | POA: Diagnosis not present

## 2023-08-11 DIAGNOSIS — I10 Essential (primary) hypertension: Secondary | ICD-10-CM | POA: Diagnosis not present

## 2023-08-11 LAB — COMPREHENSIVE METABOLIC PANEL
ALT: 22 U/L (ref 0–53)
AST: 18 U/L (ref 0–37)
Albumin: 4.9 g/dL (ref 3.5–5.2)
Alkaline Phosphatase: 93 U/L (ref 39–117)
BUN: 12 mg/dL (ref 6–23)
CO2: 28 meq/L (ref 19–32)
Calcium: 9.5 mg/dL (ref 8.4–10.5)
Chloride: 104 meq/L (ref 96–112)
Creatinine, Ser: 0.94 mg/dL (ref 0.40–1.50)
GFR: 85.33 mL/min (ref >60.00)
Glucose, Bld: 91 mg/dL (ref 70–99)
Potassium: 4.4 meq/L (ref 3.5–5.1)
Sodium: 140 meq/L (ref 135–145)
Total Bilirubin: 0.9 mg/dL (ref 0.2–1.2)
Total Protein: 7.5 g/dL (ref 6.0–8.3)

## 2023-08-11 LAB — CBC WITH DIFFERENTIAL/PLATELET
Basophils Absolute: 0 10*3/uL (ref 0.0–0.1)
Basophils Relative: 0.5 % (ref 0.0–3.0)
Eosinophils Absolute: 0.3 10*3/uL (ref 0.0–0.7)
Eosinophils Relative: 3.2 % (ref 0.0–5.0)
HCT: 46.7 % (ref 39.0–52.0)
Hemoglobin: 15.9 g/dL (ref 13.0–17.0)
Lymphocytes Relative: 30.3 % (ref 12.0–46.0)
Lymphs Abs: 2.6 10*3/uL (ref 0.7–4.0)
MCHC: 34.1 g/dL (ref 30.0–36.0)
MCV: 93.8 fl (ref 78.0–100.0)
Monocytes Absolute: 0.8 10*3/uL (ref 0.1–1.0)
Monocytes Relative: 8.9 % (ref 3.0–12.0)
Neutro Abs: 4.9 10*3/uL (ref 1.4–7.7)
Neutrophils Relative %: 57.1 % (ref 43.0–77.0)
Platelets: 302 10*3/uL (ref 150.0–400.0)
RBC: 4.98 Mil/uL (ref 4.22–5.81)
RDW: 12.7 % (ref 11.5–15.5)
WBC: 8.6 10*3/uL (ref 4.0–10.5)

## 2023-08-11 LAB — LIPID PANEL
Cholesterol: 177 mg/dL (ref 0–200)
HDL: 40.1 mg/dL (ref >39.00)
LDL Cholesterol: 108 mg/dL — ABNORMAL HIGH (ref 0–99)
NonHDL: 137.37
Total CHOL/HDL Ratio: 4
Triglycerides: 149 mg/dL (ref 0.0–149.0)
VLDL: 29.8 mg/dL (ref 0.0–40.0)

## 2023-08-11 LAB — PSA: PSA: 3.12 ng/mL (ref 0.10–4.00)

## 2023-08-11 MED ORDER — LISINOPRIL 20 MG PO TABS: 20.0000 mg | ORAL_TABLET | Freq: Every day | ORAL | 1 refills | Status: DC

## 2023-08-11 MED ORDER — TAMSULOSIN HCL 0.4 MG PO CAPS: 0.4000 mg | ORAL_CAPSULE | Freq: Every day | ORAL | 1 refills | Status: DC

## 2023-08-11 MED ORDER — LEVOCETIRIZINE DIHYDROCHLORIDE 5 MG PO TABS: 5.0000 mg | ORAL_TABLET | Freq: Every evening | ORAL | 1 refills | Status: DC

## 2023-08-11 MED ORDER — SIMVASTATIN 80 MG PO TABS: 80.0000 mg | ORAL_TABLET | Freq: Every day | ORAL | 1 refills | Status: DC

## 2023-08-11 MED ORDER — SILDENAFIL CITRATE 20 MG PO TABS: ORAL_TABLET | ORAL | 1 refills | Status: AC

## 2023-08-11 MED ORDER — BENZONATATE 200 MG PO CAPS: 200.0000 mg | ORAL_CAPSULE | Freq: Three times a day (TID) | ORAL | 1 refills | Status: AC | PRN

## 2023-08-11 MED ORDER — SILDENAFIL CITRATE 20 MG PO TABS: ORAL_TABLET | ORAL | 1 refills | Status: DC

## 2023-08-11 NOTE — Progress Notes (Signed)
 Office Note 08/11/2023  CC:  Chief Complaint  Patient presents with   Annual Exam   Patient is a 63 y.o. male who is here for annual health maintenance exam and 73-month follow-up hypertension, hypercholesterolemia, and insomnia. A/P as of last visit: "#1 hypertension, well-controlled on lisinopril 20 mg a day. Electrolytes and creatinine monitoring today.   2.  Hypercholesterolemia, doing well on simvastatin 80 mg a day. Lipid panel today.   #3 maintenance insomnia. Trazodone caused hangover effect. Will do trial of Ambien 10 mg nightly as needed, #15, 2 refills."  INTERIM HX: Jesusita Oka is feeling well other than a residual cough from having flu about 3 weeks ago.  The cough is worsening touch to take a deep breath and at night.  He does hear wheezing at night. He was prescribed albuterol when initially seen for his flu illness and he uses this and does say it helps significantly. Also using Mucinex.  He has been taking his simvastatin and lisinopril as prescribed.  He has retired and feels good about this. He recently went on a trip to [facility] and went hiking.   PMP AWARE reviewed today: most recent rx for Ambien was filled 02/02/2023, # 10, rx by me. No red flags.  Past Medical History:  Diagnosis Date   Allergy    BPH with obstruction/lower urinary tract symptoms    Dr. Marlou Porch 06/28/17--Flomax trial.   Diverticulosis of colon 05/2019   DYSLIPIDEMIA 08/25/2007   Family history of prostate cancer    Brother. Pt followed by Dr. Laverle Patter.   Gustatory rhinitis    History of gross hematuria 1995   cystoscopy neg   HYPERTENSION 05/15/2009   Increased prostate specific antigen (PSA) velocity    06/2018: Rpt at [facility] summer 2020 improved.  06/2019 stable.   Kidney stones 05/15/2009   Dr. Laverle Patter.  ESWL 2010.   Palpitations 07/04/2009   RECTAL BLEEDING 08/25/2007    Past Surgical History:  Procedure Laterality Date   COLONOSCOPY  04/08/07; 05/29/19   normal 2008 and 2020  (Dr. Drue Flirt): recall 2030.   CYSTOSCOPY W/ URETEROSCOPY W/ LITHOTRIPSY  2010    Family History  Problem Relation Age of Onset   Prostate cancer Brother    Hyperlipidemia Brother    Hypertension Brother    Hyperlipidemia Mother    Hypertension Mother    Lung cancer Father        smoker   Hypertension Father    Clotting disorder Son    Hypertension Brother    Diabetes Neg Hx    Colon cancer Neg Hx    Colon polyps Neg Hx    Esophageal cancer Neg Hx    Stomach cancer Neg Hx    Rectal cancer Neg Hx     Social History   Socioeconomic History   Marital status: Divorced    Spouse name: Not on file   Number of children: Not on file   Years of education: Not on file   Highest education level: Not on file  Occupational History   Occupation: Electrical engineer  Tobacco Use   Smoking status: Never   Smokeless tobacco: Never  Vaping Use   Vaping status: Never Used  Substance and Sexual Activity   Alcohol use: Yes    Alcohol/week: 2.0 standard drinks of alcohol    Types: 1 Glasses of wine, 1 Cans of beer per week    Comment: 1-2/week   Drug use: No   Sexual activity: Not on file  Other Topics  Concern   Not on file  Social History Narrative   Married, 2 sons.   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   No T/A/Ds.   Social Drivers of Corporate investment banker Strain: Not on file  Food Insecurity: Not on file  Transportation Needs: Not on file  Physical Activity: Not on file  Stress: Not on file  Social Connections: Not on file  Intimate Partner Violence: Not on file    Outpatient Medications Prior to Visit  Medication Sig Dispense Refill   levocetirizine (XYZAL) 5 MG tablet Take 1 tablet (5 mg total) by mouth every evening. 90 tablet 0   sildenafil (REVATIO) 20 MG tablet TAKE 1 TO 5 TABLETS BY  MOUTH DAILY AS NEEDED 30 TO 60 MINUTES PRIOR TO  INTERCOURSE 20 tablet 1   traZODone (DESYREL) 50 MG tablet 1-2 tabs po qhs  prn insomnia 30 tablet 1   lisinopril (ZESTRIL) 20 MG tablet Take 1 tablet (20 mg total) by mouth daily. 90 tablet 0   simvastatin (ZOCOR) 80 MG tablet Take 1 tablet (80 mg total) by mouth daily. 90 tablet 0   tamsulosin (FLOMAX) 0.4 MG CAPS capsule Take 1 capsule (0.4 mg total) by mouth daily. 90 capsule 0   zolpidem (AMBIEN) 10 MG tablet Take 1 tablet (10 mg total) by mouth at bedtime as needed for sleep. 10 tablet 3   No facility-administered medications prior to visit.    Allergies  Allergen Reactions   Diphenhydramine Hcl     REACTION: Swelling in hans, rash    Review of Systems  Constitutional:  Negative for appetite change, chills, fatigue and fever.  HENT:  Negative for congestion, dental problem, ear pain and sore throat.   Eyes:  Negative for discharge, redness and visual disturbance.  Respiratory:  Negative for cough, chest tightness, shortness of breath and wheezing.   Cardiovascular:  Negative for chest pain, palpitations and leg swelling.  Gastrointestinal:  Negative for abdominal pain, blood in stool, diarrhea, nausea and vomiting.  Genitourinary:  Negative for difficulty urinating, dysuria, flank pain, frequency, hematuria and urgency.  Musculoskeletal:  Negative for arthralgias, back pain, joint swelling, myalgias and neck stiffness.  Skin:  Negative for pallor and rash.  Neurological:  Negative for dizziness, speech difficulty, weakness and headaches.  Hematological:  Negative for adenopathy. Does not bruise/bleed easily.  Psychiatric/Behavioral:  Negative for confusion and sleep disturbance. The patient is not nervous/anxious.     PE;    08/11/2023    1:08 PM 01/29/2023    8:07 AM 07/31/2022    7:56 AM  Vitals with BMI  Height  6' 1.75" 6' 1.75"  Weight 212 lbs 10 oz 219 lbs 219 lbs  BMI  28.32 28.32  Systolic 127 125 401  Diastolic 82 81 82  Pulse 81 96 76   Gen: Alert, well appearing.  Patient is oriented to person, place, time, and situation. AFFECT:  pleasant, lucid thought and speech. ENT: Ears: EACs clear, normal epithelium.  TMs with good light reflex and landmarks bilaterally.  Eyes: no injection, icteris, swelling, or exudate.  EOMI, PERRLA. Nose: no drainage or turbinate edema/swelling.  No injection or focal lesion.  Mouth: lips without lesion/swelling.  Oral mucosa pink and moist.  Dentition intact and without obvious caries or gingival swelling.  Oropharynx without erythema, exudate, or swelling.  Neck: supple/nontender.  No LAD, mass, or TM.  Carotid pulses 2+ bilaterally, without bruits. CV: RRR, no m/r/g.  LUNGS: CTA bilat, nonlabored resps, good aeration in all lung fields. ABD: soft, NT, ND, BS normal.  No hepatospenomegaly or mass.  No bruits. EXT: no clubbing, cyanosis, or edema.  Musculoskeletal: no joint swelling, erythema, warmth, or tenderness.  ROM of all joints intact. Skin - no sores or suspicious lesions or rashes or color changes  Pertinent labs:  Lab Results  Component Value Date   TSH 2.29 07/17/2021   Lab Results  Component Value Date   WBC 7.1 07/31/2022   HGB 16.1 07/31/2022   HCT 46.8 07/31/2022   MCV 92.6 07/31/2022   PLT 227.0 07/31/2022   Lab Results  Component Value Date   CREATININE 1.05 01/29/2023   BUN 15 01/29/2023   NA 140 01/29/2023   K 4.0 01/29/2023   CL 104 01/29/2023   CO2 26 01/29/2023   Lab Results  Component Value Date   ALT 20 07/31/2022   AST 17 07/31/2022   ALKPHOS 93 07/31/2022   BILITOT 0.7 07/31/2022   Lab Results  Component Value Date   CHOL 166 01/29/2023   Lab Results  Component Value Date   HDL 36.50 (L) 01/29/2023   Lab Results  Component Value Date   LDLCALC 100 (H) 01/29/2023   Lab Results  Component Value Date   TRIG 147.0 01/29/2023   Lab Results  Component Value Date   CHOLHDL 5 01/29/2023   Lab Results  Component Value Date   PSA 2.33 07/31/2022   PSA 2.12 07/17/2021   PSA 1.89 07/16/2020    ASSESSMENT AND PLAN:   No  problem-specific Assessment & Plan notes found for this encounter.  1) health maintenance exam: Reviewed age and gender appropriate health maintenance issues (prudent diet, regular exercise, health risks of tobacco and excessive alcohol, use of seatbelts, fire alarms in home, use of sunscreen).  Also reviewed age and gender appropriate health screening as well as vaccine recommendations. Vaccines:  Tdap->declined.  Prevnar 20-->declined. Labs:  fasting HP + PSA.  Prostate ca screening: PSA ordered. Colon ca screening: recall 2030.  #2 hypertension, well-controlled on lisinopril 20 mg a day. Checking electrolytes and creatinine today.  3.  Hypercholesterolemia, doing well on simvastatin 80 mg a day. Checking lipid panel and hepatic panel today.  #4 acute bronchitis. Triggered by influenza illness a few weeks ago. Some residual bronchospasm.  Continue albuterol inhaler and will add Tessalon Perles 200 mg 3 times daily as needed.  This has helped him well in the past.  #5 STD screening. He requests this today. Denies any symptoms.  An After Visit Summary was printed and given to the patient.  FOLLOW UP:  Return in about 6 months (around 02/11/2024) for routine chronic illness f/u.  Signed:  Santiago Bumpers, MD           08/11/2023

## 2023-08-11 NOTE — Telephone Encounter (Signed)
 Routing to clinic for review.  Copied from CRM [id number]. Topic: Clinical - Medication Question >> Aug 11, 2023  4:37 PM [place] J wrote: Reason for CRM: A pharmacist by the name of Vickie, calling in from [facility]. She would like to follow up with some questions regarding patient's medication: sildenafil (REVATIO) 20 MG tablet  Callback Number: [id number] Reference #: [id number]

## 2023-08-12 LAB — HIV ANTIBODY (ROUTINE TESTING W REFLEX): HIV 1&2 Ab, 4th Generation: NONREACTIVE

## 2023-08-12 LAB — RPR: RPR Ser Ql: NONREACTIVE

## 2023-08-12 LAB — URINE CYTOLOGY ANCILLARY ONLY
Chlamydia: NEGATIVE
Comment: NEGATIVE
Comment: NORMAL
Neisseria Gonorrhea: NEGATIVE

## 2023-08-12 NOTE — Telephone Encounter (Signed)
 Primary Information  Source  Jared Jimenez, Jared "Jesusita Oka" (Patient)   Subject  Jared Boss "Jesusita Oka" (Patient)   Topic  General - Other    Communication  Reason for CRM: [facility] called regarding sildenafil (REVATIO) 20 MG tablet; they would like to know the diagnosis reason for medication. Please contact @ [phone] Reference # [id number]

## 2023-08-15 ENCOUNTER — Encounter: Payer: Self-pay | Admitting: Family Medicine

## 2023-08-20 ENCOUNTER — Telehealth: Payer: Self-pay

## 2023-08-20 NOTE — Telephone Encounter (Signed)
 Communication  Reason for CRM: Patient [facility] and would like to know if Dr Milinda Cave could represcribe one to him.He stated that they discussed this at his physical on last week. He used it for when he had the flu,and now he just have an ongoing issue that he wants to use it for.

## 2023-08-23 MED ORDER — HYDROCODONE BIT-HOMATROP MBR 5-1.5 MG/5ML PO SOLN: 5.0000 mL | Freq: Three times a day (TID) | ORAL | 0 refills | Status: DC | PRN

## 2023-08-23 MED ORDER — ALBUTEROL SULFATE HFA 108 (90 BASE) MCG/ACT IN AERS: 2.0000 | INHALATION_SPRAY | Freq: Four times a day (QID) | RESPIRATORY_TRACT | 0 refills | Status: DC | PRN

## 2023-08-23 NOTE — Telephone Encounter (Signed)
 Pt advised of provider recommendations.

## 2023-08-23 NOTE — Telephone Encounter (Signed)
 Hydrocodone cough syrup sent. Return to see me if cough not significantly improved after another 7-10 days (virtual is ok if he can't come in person).

## 2023-08-23 NOTE — Addendum Note (Signed)
 Addended by: Daron Offer A on: 08/23/2023 10:35 AM   Modules accepted: Orders

## 2023-08-23 NOTE — Addendum Note (Signed)
 Addended by: Jeoffrey Massed on: 08/23/2023 01:19 PM   Modules accepted: Orders

## 2023-09-14 ENCOUNTER — Other Ambulatory Visit: Payer: Self-pay | Admitting: Family Medicine

## 2023-10-06 DIAGNOSIS — K080 Exfoliation of teeth due to systemic causes: Secondary | ICD-10-CM | POA: Diagnosis not present

## 2024-02-10 NOTE — Patient Instructions (Signed)

## 2024-02-11 ENCOUNTER — Encounter: Payer: Self-pay | Admitting: Family Medicine

## 2024-02-11 ENCOUNTER — Ambulatory Visit: Admitting: Family Medicine

## 2024-02-11 VITALS — BP 110/73 | HR 74 | Temp 97.6°F | Wt 216.6 lb

## 2024-02-11 DIAGNOSIS — E7800 Pure hypercholesterolemia, unspecified: Secondary | ICD-10-CM

## 2024-02-11 DIAGNOSIS — I10 Essential (primary) hypertension: Secondary | ICD-10-CM

## 2024-02-11 LAB — COMPREHENSIVE METABOLIC PANEL WITH GFR
ALT: 16 U/L (ref 0–53)
AST: 15 U/L (ref 0–37)
Albumin: 4.7 g/dL (ref 3.5–5.2)
Alkaline Phosphatase: 87 U/L (ref 39–117)
BUN: 16 mg/dL (ref 6–23)
CO2: 28 meq/L (ref 19–32)
Calcium: 8.7 mg/dL (ref 8.4–10.5)
Chloride: 105 meq/L (ref 96–112)
Creatinine, Ser: 0.95 mg/dL (ref 0.40–1.50)
GFR: 83.96 mL/min (ref >60.00)
Glucose, Bld: 82 mg/dL (ref 70–99)
Potassium: 4.3 meq/L (ref 3.5–5.1)
Sodium: 141 meq/L (ref 135–145)
Total Bilirubin: 0.9 mg/dL (ref 0.2–1.2)
Total Protein: 7 g/dL (ref 6.0–8.3)

## 2024-02-11 LAB — LIPID PANEL
Cholesterol: 157 mg/dL (ref 0–200)
HDL: 36.7 mg/dL — ABNORMAL LOW (ref >39.00)
LDL Cholesterol: 95 mg/dL (ref 0–99)
NonHDL: 120.07
Total CHOL/HDL Ratio: 4
Triglycerides: 124 mg/dL (ref 0.0–149.0)
VLDL: 24.8 mg/dL (ref 0.0–40.0)

## 2024-02-11 MED ORDER — SIMVASTATIN 80 MG PO TABS: 80.0000 mg | ORAL_TABLET | Freq: Every day | ORAL | 1 refills | Status: AC

## 2024-02-11 MED ORDER — LISINOPRIL 20 MG PO TABS: 20.0000 mg | ORAL_TABLET | Freq: Every day | ORAL | 1 refills | Status: AC

## 2024-02-11 MED ORDER — TAMSULOSIN HCL 0.4 MG PO CAPS: 0.4000 mg | ORAL_CAPSULE | Freq: Every day | ORAL | 1 refills | Status: AC

## 2024-02-11 MED ORDER — LEVOCETIRIZINE DIHYDROCHLORIDE 5 MG PO TABS: 5.0000 mg | ORAL_TABLET | Freq: Every evening | ORAL | 1 refills | Status: AC

## 2024-02-11 NOTE — Progress Notes (Signed)
 OFFICE VISIT  02/11/2024  CC:  Chief Complaint  Patient presents with   Hypertension    Patient is a 63 y.o. male who presents for 45-month follow-up hypertension and hypercholesterolemia. A/P as of last visit: #1 hypertension, well-controlled on lisinopril  20 mg a day. Checking electrolytes and creatinine today.   2.  Hypercholesterolemia, doing well on simvastatin  80 mg a day. Checking lipid panel and hepatic panel today.   #3 acute bronchitis. Triggered by influenza illness a few weeks ago. Some residual bronchospasm.  Continue albuterol  inhaler and will add Tessalon  Perles 200 mg 3 times daily as needed.  This has helped him well in the past.   #4 STD screening. He requests this today. Denies any symptoms.  INTERIM HX: All labs normal last visit.  Cholesterol level pretty stable.  No changes made.  Rolan feels well.  He continues to enjoy retirement.  [place]  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  Unfortunately his brother was recently diagnosed with esophageal cancer.  He says he had chronic reflux problems and dysphagia.  Rolan does not have any reflux or dysphagia.  ROS as above, plus--> no fevers, no CP, no SOB, no wheezing, no cough, no dizziness, no HAs, no rashes, no melena/hematochezia.  No polyuria or polydipsia.  No myalgias or arthralgias.  No focal weakness, paresthesias, or tremors.  No acute vision or hearing abnormalities.  No dysuria or unusual/new urinary urgency or frequency.  No recent changes in lower legs. No n/v/d or abd pain.  No palpitations.    Past Medical History:  Diagnosis Date   Allergy    BPH with obstruction/lower urinary tract symptoms    Dr. Cam 06/28/17--Flomax  trial.   Diverticulosis of colon 05/2019   DYSLIPIDEMIA 08/25/2007   Family history of prostate cancer    Brother. Pt followed by Dr. Renda.   Gustatory rhinitis    History of gross hematuria 1995   cystoscopy neg    HYPERTENSION 05/15/2009   Increased prostate specific antigen (PSA) velocity    06/2018: Rpt at [facility] summer 2020 improved.  06/2019 stable.   Kidney stones 05/15/2009   Dr. Renda.  ESWL 2010.   Palpitations 07/04/2009   RECTAL BLEEDING 08/25/2007    Past Surgical History:  Procedure Laterality Date   COLONOSCOPY  04/08/07; 05/29/19   normal 2008 and 2020 (Dr. Paulett): recall 2030.   CYSTOSCOPY W/ URETEROSCOPY W/ LITHOTRIPSY  2010    Outpatient Medications Prior to Visit  Medication Sig Dispense Refill   albuterol  (VENTOLIN  HFA) 108 (90 Base) MCG/ACT inhaler TAKE 2 PUFFS BY MOUTH EVERY 6 HOURS AS NEEDED FOR WHEEZE OR SHORTNESS OF BREATH 18 g 1   benzonatate  (TESSALON ) 200 MG capsule Take 1 capsule (200 mg total) by mouth 3 (three) times daily as needed for cough. 30 capsule 1   sildenafil  (REVATIO ) 20 MG tablet TAKE 1 TO 5 TABLETS BY  MOUTH DAILY AS NEEDED 30 TO 60 MINUTES PRIOR TO  INTERCOURSE 20 tablet 1   HYDROcodone  bit-homatropine (HYCODAN) 5-1.5 MG/5ML syrup Take 5 mLs by mouth every 8 (eight) hours as needed for cough. 120 mL 0   levocetirizine (XYZAL ) 5 MG tablet Take 1 tablet (5 mg total) by mouth every evening. 90 tablet 1   lisinopril  (ZESTRIL ) 20 MG tablet Take 1 tablet (20 mg total) by mouth daily. 90 tablet 1   simvastatin  (ZOCOR ) 80 MG tablet Take 1 tablet (80 mg total) by mouth  daily. 90 tablet 1   tamsulosin  (FLOMAX ) 0.4 MG CAPS capsule Take 1 capsule (0.4 mg total) by mouth daily. 90 capsule 1   No facility-administered medications prior to visit.    Allergies  Allergen Reactions   Diphenhydramine Hcl     REACTION: Swelling in hans, rash    Review of Systems As per HPI  PE:    02/11/2024    1:06 PM 08/11/2023    1:08 PM 01/29/2023    8:07 AM  Vitals with BMI  Height   6' 1.75  Weight 216 lbs 10 oz 212 lbs 10 oz 219 lbs  BMI   28.32  Systolic 110 127 874  Diastolic 73 82 81  Pulse 74 81 96     Physical Exam  Gen: Alert, well appearing.  Patient  is oriented to person, place, time, and situation. AFFECT: pleasant, lucid thought and speech. CV: RRR, no m/r/g.   LUNGS: CTA bilat, nonlabored resps, good aeration in all lung fields.   LABS:  Last CBC Lab Results  Component Value Date   WBC 8.6 08/11/2023   HGB 15.9 08/11/2023   HCT 46.7 08/11/2023   MCV 93.8 08/11/2023   RDW 12.7 08/11/2023   PLT 302.0 08/11/2023   Last metabolic panel Lab Results  Component Value Date   GLUCOSE 91 08/11/2023   NA 140 08/11/2023   K 4.4 08/11/2023   CL 104 08/11/2023   CO2 28 08/11/2023   BUN 12 08/11/2023   CREATININE 0.94 08/11/2023   GFR 85.33 08/11/2023   CALCIUM 9.5 08/11/2023   PROT 7.5 08/11/2023   ALBUMIN 4.9 08/11/2023   BILITOT 0.9 08/11/2023   ALKPHOS 93 08/11/2023   AST 18 08/11/2023   ALT 22 08/11/2023   Last lipids Lab Results  Component Value Date   CHOL 177 08/11/2023   HDL 40.10 08/11/2023   LDLCALC 108 (H) 08/11/2023   LDLDIRECT 163.6 01/18/2007   TRIG 149.0 08/11/2023   CHOLHDL 4 08/11/2023   Last thyroid  functions Lab Results  Component Value Date   TSH 2.29 07/17/2021    IMPRESSION AND PLAN:  1 hypertension, well-controlled on lisinopril  20 mg a day. Checking electrolytes and creatinine today.   2.  Hypercholesterolemia, doing well on simvastatin  80 mg a day. Checking lipid panel and hepatic panel today  #3 BPH.  Well-controlled with tamsulosin  0.4 mg nightly.  Refilled today.  An After Visit Summary was printed and given to the patient.  FOLLOW UP: Return in about 6 months (around 08/10/2024) for annual CPE (fasting).  Signed:  Gerlene Hockey, MD           02/11/2024

## 2024-02-14 ENCOUNTER — Ambulatory Visit: Payer: Self-pay | Admitting: Family Medicine

## 2024-02-14 NOTE — Telephone Encounter (Signed)
 I did not do a PSA this time. This is a blood test that is done once a year.  He last had this done in March 2025 so I will repeat it in March 2026. -thx

## 2024-02-14 NOTE — Telephone Encounter (Signed)
 No further action needed.

## 2024-03-29 DIAGNOSIS — L570 Actinic keratosis: Secondary | ICD-10-CM | POA: Diagnosis not present

## 2024-03-29 DIAGNOSIS — D1801 Hemangioma of skin and subcutaneous tissue: Secondary | ICD-10-CM | POA: Diagnosis not present

## 2024-03-29 DIAGNOSIS — D170 Benign lipomatous neoplasm of skin and subcutaneous tissue of head, face and neck: Secondary | ICD-10-CM | POA: Diagnosis not present

## 2024-03-29 DIAGNOSIS — L821 Other seborrheic keratosis: Secondary | ICD-10-CM | POA: Diagnosis not present

## 2024-03-29 DIAGNOSIS — L814 Other melanin hyperpigmentation: Secondary | ICD-10-CM | POA: Diagnosis not present
